# Patient Record
Sex: Male | Born: 1985 | Race: Black or African American | Hispanic: No | Marital: Single | Smoking: Current every day smoker
Health system: Southern US, Community
[De-identification: ages and names within clinical notes are randomized; demographics above are authoritative.]

## PROBLEM LIST (undated history)

## (undated) DIAGNOSIS — K219 Gastro-esophageal reflux disease without esophagitis: Secondary | ICD-10-CM

---
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
## 2004-11-12 ENCOUNTER — Emergency Department (HOSPITAL_COMMUNITY): Admission: EM | Admit: 2004-11-12 | Discharge: 2004-11-12 | Payer: Self-pay | Admitting: Emergency Medicine

## 2009-03-20 ENCOUNTER — Emergency Department (HOSPITAL_COMMUNITY): Admission: EM | Admit: 2009-03-20 | Discharge: 2009-03-20 | Payer: Self-pay | Admitting: Emergency Medicine

## 2011-06-22 ENCOUNTER — Inpatient Hospital Stay (INDEPENDENT_AMBULATORY_CARE_PROVIDER_SITE_OTHER)
Admission: RE | Admit: 2011-06-22 | Discharge: 2011-06-22 | Disposition: A | Discharge status: Home / Self Care | Payer: Self-pay | Source: Ambulatory Visit | Attending: Family Medicine | Admitting: Family Medicine

## 2011-06-22 DIAGNOSIS — J020 Streptococcal pharyngitis: Secondary | ICD-10-CM

## 2012-08-11 ENCOUNTER — Encounter (HOSPITAL_COMMUNITY): Payer: Self-pay | Admitting: Emergency Medicine

## 2012-08-11 ENCOUNTER — Emergency Department (HOSPITAL_COMMUNITY)
Admission: EM | Admit: 2012-08-11 | Discharge: 2012-08-12 | Disposition: A | Discharge status: Home / Self Care | Payer: Self-pay | Attending: Emergency Medicine | Admitting: Emergency Medicine

## 2012-08-11 DIAGNOSIS — F17200 Nicotine dependence, unspecified, uncomplicated: Secondary | ICD-10-CM | POA: Insufficient documentation

## 2012-08-11 DIAGNOSIS — IMO0002 Reserved for concepts with insufficient information to code with codable children: Secondary | ICD-10-CM | POA: Insufficient documentation

## 2012-08-11 DIAGNOSIS — L03119 Cellulitis of unspecified part of limb: Secondary | ICD-10-CM

## 2012-08-11 MED ORDER — DIPHENHYDRAMINE HCL 50 MG/ML IJ SOLN
25.0000 mg | Freq: Once | INTRAMUSCULAR | Status: AC
  Administered 2012-08-11: 25 mg via INTRAVENOUS
  Filled 2012-08-11: qty 1

## 2012-08-11 MED ORDER — CLINDAMYCIN PHOSPHATE 600 MG/50ML IV SOLN
600.0000 mg | Freq: Once | INTRAVENOUS | Status: AC
  Administered 2012-08-11: 600 mg via INTRAVENOUS
  Filled 2012-08-11: qty 50

## 2012-08-11 NOTE — ED Provider Notes (Signed)
History     CSN: [id number]  Arrival date & time 08/11/12  1826   First MD Initiated Contact with Patient 08/11/12 2236      Chief Complaint  Patient presents with  . Insect Bite    (Consider location/radiation/quality/duration/timing/severity/associated sxs/prior treatment) HPI History provided by pt.   Pt woke w/ multiple, pruritic insect bites on upper extremities 2 days ago.  Woke this am w/ edema, erythema and severe pruritis of dorsal surface of right forearm.  No associated fever or pain.  Edema has improved throughout course of day.  Has not taken anything for sx.  Denies trauma.  No PMH.   History reviewed. No pertinent past medical history.  History reviewed. No pertinent past surgical history.  No family history on file.  History  Substance Use Topics  . Smoking status: Current Every Day Smoker  . Smokeless tobacco: Not on file  . Alcohol Use: No      Review of Systems  All other systems reviewed and are negative.    Allergies  Review of patient's allergies indicates no known allergies.  Home Medications  No current outpatient prescriptions on file.  BP 125/87  Pulse 86  Temp 98.2 F (36.8 C) (Oral)  Resp 18  SpO2 98%  Physical Exam  Nursing note and vitals reviewed. Constitutional: He is oriented to person, place, and time. He appears well-developed and well-nourished. No distress.  HENT:  Head: Normocephalic and atraumatic.  Eyes:       Normal appearance  Neck: Normal range of motion.  Pulmonary/Chest: Effort normal.  Musculoskeletal: Normal range of motion.       Entire dorsal aspect of left forearm with erythema and warmth to touch.  Evidence of lymphatic drainage on inner aspect of arm.  No obvious insect bite or abscess in this location.  Non-tender.    Neurological: He is alert and oriented to person, place, and time.  Psychiatric: He has a normal mood and affect. His behavior is normal.    ED Course  Procedures (including critical  care time)  Labs Reviewed - No data to display No results found.   1. Cellulitis of forearm       MDM  Healthy 25yo M presents w/ cellulitis of left forearm.  Rash is pruritic but there is associated edema and evidence of lymphatic drainage.  Pt received one dose of IV clinda as well as benadryl.  The edema mildly improved and patient is requesting discharge.  Nursing staff has drawn margins of infection.  Pt d/c'd home w/ clinda.  Return precautions discussed.         Otilio Miu, [place] 08/12/12 0110

## 2012-08-11 NOTE — ED Notes (Signed)
Pt reports possible spider bite on left arm noted yesterday morning. Pt also reports multiple mosquito bites on right arm.

## 2012-08-12 MED ORDER — CLINDAMYCIN HCL 150 MG PO CAPS: 300.0000 mg | ORAL_CAPSULE | Freq: Two times a day (BID) | ORAL | Status: DC

## 2012-08-13 NOTE — ED Provider Notes (Signed)
Medical screening examination/treatment/procedure(s) were performed by non-physician practitioner and as supervising physician I was immediately available for consultation/collaboration.   Richardean Canal, MD 08/13/12 1745

## 2013-05-06 ENCOUNTER — Emergency Department (HOSPITAL_COMMUNITY)
Admission: EM | Admit: 2013-05-06 | Discharge: 2013-05-06 | Disposition: A | Discharge status: Home / Self Care | Payer: Self-pay | Attending: Emergency Medicine | Admitting: Emergency Medicine

## 2013-05-06 ENCOUNTER — Encounter (HOSPITAL_COMMUNITY): Payer: Self-pay | Admitting: Emergency Medicine

## 2013-05-06 DIAGNOSIS — R0982 Postnasal drip: Secondary | ICD-10-CM | POA: Insufficient documentation

## 2013-05-06 DIAGNOSIS — R220 Localized swelling, mass and lump, head: Secondary | ICD-10-CM | POA: Insufficient documentation

## 2013-05-06 DIAGNOSIS — K1379 Other lesions of oral mucosa: Secondary | ICD-10-CM

## 2013-05-06 DIAGNOSIS — K029 Dental caries, unspecified: Secondary | ICD-10-CM | POA: Insufficient documentation

## 2013-05-06 DIAGNOSIS — F17200 Nicotine dependence, unspecified, uncomplicated: Secondary | ICD-10-CM | POA: Insufficient documentation

## 2013-05-06 DIAGNOSIS — J3489 Other specified disorders of nose and nasal sinuses: Secondary | ICD-10-CM | POA: Insufficient documentation

## 2013-05-06 MED ORDER — DEXAMETHASONE 2 MG PO TABS
6.0000 mg | ORAL_TABLET | Freq: Once | ORAL | Status: AC
  Administered 2013-05-06: 6 mg via ORAL
  Filled 2013-05-06: qty 3

## 2013-05-06 NOTE — ED Notes (Signed)
PT. REPORTS SOB AFTER TAKING SOMEONE'S AMOXICILLIN THIS AFTERNOON , ALSO REPORTS SWOLLEN UVULA , RESPIRATIONS UNLABORED / O2 SAT = 100 % / AIRWAY INTACT.

## 2013-05-06 NOTE — ED Provider Notes (Signed)
Medical screening examination/treatment/procedure(s) were performed by non-physician practitioner and as supervising physician I was immediately available for consultation/collaboration.   Sweta Halseth M Jaivion Kingsley, MD 05/06/13 0500 

## 2013-05-06 NOTE — ED Provider Notes (Signed)
   History    CSN: [id number] Arrival date & time 05/06/13  0007  First MD Initiated Contact with Patient 05/06/13 0122     Chief Complaint  Patient presents with  . Shortness of Breath   (Consider location/radiation/quality/duration/timing/severity/associated sxs/prior Treatment) HPI Comments: Patient took an amoxicillin due to several cavities than developed a feeling of SOB than has resolved, the Rx was not his he has not know allergies. Now reports that his  uvula is swollen   Patient is a 27 y.o. male presenting with shortness of breath. The history is provided by the patient.  Shortness of Breath Severity:  Mild Timing:  Constant Progression:  Resolved Chronicity:  New Context: not known allergens   Relieved by:  Rest Worsened by:  Nothing tried Ineffective treatments:  None tried Associated symptoms: no chest pain, no fever, no headaches, no neck pain, no rash, no sore throat and no wheezing    History reviewed. No pertinent past medical history. History reviewed. No pertinent past surgical history. No family history on file. History  Substance Use Topics  . Smoking status: Current Every Day Smoker  . Smokeless tobacco: Not on file  . Alcohol Use: No    Review of Systems  Constitutional: Negative for fever.  HENT: Positive for congestion, dental problem and postnasal drip. Negative for sore throat, trouble swallowing, neck pain, neck stiffness and voice change.   Respiratory: Positive for shortness of breath. Negative for wheezing.   Cardiovascular: Negative for chest pain.  Gastrointestinal: Negative for nausea.  Skin: Negative for rash.  Neurological: Negative for dizziness and headaches.    Allergies  Review of patient's allergies indicates no known allergies.  Home Medications   Current Outpatient Rx  Name  Route  Sig  Dispense  Refill  . clindamycin (CLEOCIN) 150 MG capsule   Oral   Take 2 capsules (300 mg total) by mouth 2 (two) times daily.   14  capsule   0    BP 140/87  Pulse 70  Temp(Src) 97.9 F (36.6 C) (Oral)  Resp 17  SpO2 100% Physical Exam  Constitutional: He is oriented to person, place, and time. He appears well-developed and well-nourished.  HENT:  Head: Normocephalic and atraumatic.  Right Ear: External ear normal.  Left Ear: External ear normal.  Mouth/Throat: Uvula is midline. Edematous present.    Neck: Normal range of motion.  Cardiovascular: Normal rate and regular rhythm.   Pulmonary/Chest: Effort normal and breath sounds normal. No respiratory distress. He has no wheezes. He exhibits no tenderness.  Abdominal: Soft.  Musculoskeletal: Normal range of motion.  Lymphadenopathy:    He has no cervical adenopathy.  Neurological: He is alert and oriented to person, place, and time.  Skin: Skin is warm. No erythema.    ED Course  Procedures (including critical care time) Labs Reviewed - No data to display No results found. No diagnosis found.  MDM   Patient given Decadron for swelling and possible allergic reaction although not in any distress at this time   Arman Filter, NP 05/06/13 0205

## 2013-05-06 NOTE — ED Notes (Signed)
Patient states that he has felt that his "uvula" has been swollen for the past couple of weeks and felt a "knot" in his throat. Due to this, today the patient stated "I took an amoxicillin from a friend thinking it would help my throat." Approx 2 hours after taking medication patient states that he became short of breath and felt that he could not take a full breath. Pt friend states that she also believes that anxiety has something to do with the way he is feeling right now, due to "a lot of stress in his life." O2 100% on RA.

## 2013-05-07 ENCOUNTER — Telehealth (HOSPITAL_COMMUNITY): Payer: Self-pay | Admitting: *Deleted

## 2013-05-15 ENCOUNTER — Encounter (HOSPITAL_COMMUNITY): Payer: Self-pay | Admitting: *Deleted

## 2013-05-15 ENCOUNTER — Emergency Department (INDEPENDENT_AMBULATORY_CARE_PROVIDER_SITE_OTHER)
Admission: EM | Admit: 2013-05-15 | Discharge: 2013-05-15 | Disposition: A | Discharge status: Home / Self Care | Payer: Self-pay | Source: Home / Self Care | Attending: Family Medicine | Admitting: Family Medicine

## 2013-05-15 DIAGNOSIS — R140 Abdominal distension (gaseous): Secondary | ICD-10-CM

## 2013-05-15 DIAGNOSIS — R143 Flatulence: Secondary | ICD-10-CM

## 2013-05-15 DIAGNOSIS — R1013 Epigastric pain: Secondary | ICD-10-CM

## 2013-05-15 MED ORDER — OMEPRAZOLE 20 MG PO CPDR: 20.0000 mg | DELAYED_RELEASE_CAPSULE | Freq: Every day | ORAL | Status: DC

## 2013-05-15 MED ORDER — ALUM & MAG HYDROXIDE-SIMETH 400-400-40 MG/5ML PO SUSP: 10.0000 mL | Freq: Four times a day (QID) | ORAL | Status: DC | PRN

## 2013-05-15 NOTE — ED Provider Notes (Signed)
History    CSN: [id number] Arrival date & time 05/15/13  1050  First MD Initiated Contact with Patient 05/15/13 1131     Chief Complaint  Patient presents with  . Sore Throat   (Consider location/radiation/quality/duration/timing/severity/associated sxs/prior Treatment) HPI Comments: 27 year old male presents complaining of intermittent upper abdominal bloating, feelings of early satiety, and feeling swelling in his throat like he is "swallowing his uvula." He also woke up yesterday and vomited but has not felt nauseous other than this. This is been going on intermittently for months but he states that today it is actually feeling better than it was. He describes the bloating as feeling like there is a large amount of gas in his stomach and he feels like eating makes this worse. He also admits to frequent belching and flatulence as well as a recent history of constipation that now seems to have resolved. He denies fever, palpitations, abdominal pain, shortness of breath, chest pain, or rash. He has been taking ibuprofen for a recent dental extraction.  Patient is a 27 y.o. male presenting with pharyngitis.  Sore Throat Pertinent negatives include no chest pain, no abdominal pain and no shortness of breath.   History reviewed. No pertinent past medical history. History reviewed. No pertinent past surgical history. History reviewed. No pertinent family history. History  Substance Use Topics  . Smoking status: Current Every Day Smoker  . Smokeless tobacco: Not on file  . Alcohol Use: No    Review of Systems  Constitutional: Negative for fever, chills, fatigue and unexpected weight change.  HENT: Positive for sore throat. Negative for ear pain, sneezing, mouth sores, trouble swallowing, neck pain, neck stiffness, sinus pressure and ear discharge.   Eyes: Negative for pain, redness and visual disturbance.  Respiratory: Negative for cough and shortness of breath.   Cardiovascular:  Negative for chest pain, palpitations and leg swelling.  Gastrointestinal: Positive for constipation and abdominal distention. Negative for nausea, vomiting, abdominal pain, diarrhea, blood in stool, anal bleeding and rectal pain.       Bloating  Endocrine: Negative for polydipsia and polyuria.  Genitourinary: Negative for dysuria, urgency, frequency and hematuria.  Musculoskeletal: Negative for myalgias and arthralgias.  Skin: Negative for rash.  Neurological: Negative for dizziness, weakness and light-headedness.    Allergies  Amoxicillin  Home Medications   Current Outpatient Rx  Name  Route  Sig  Dispense  Refill  . alum & mag hydroxide-simeth (MAALOX PLUS) 400-400-40 MG/5ML suspension   Oral   Take 10 mLs by mouth every 6 (six) hours as needed for indigestion.   355 mL   0   . clindamycin (CLEOCIN) 150 MG capsule   Oral   Take 2 capsules (300 mg total) by mouth 2 (two) times daily.   14 capsule   0   . omeprazole (PRILOSEC) 20 MG capsule   Oral   Take 1 capsule (20 mg total) by mouth daily.   30 capsule   0    BP 107/68  Pulse 52  Temp(Src) 98.1 F (36.7 C) (Oral)  Resp 18  SpO2 100% Physical Exam  Nursing note and vitals reviewed. Constitutional: He is oriented to person, place, and time. He appears well-developed and well-nourished. No distress.  HENT:  Head: Normocephalic and atraumatic.  Mouth/Throat: Uvula is midline, oropharynx is clear and moist and mucous membranes are normal. No oral lesions. No dental abscesses or edematous.  Eyes: EOM are normal. Pupils are equal, round, and reactive to light.  Cardiovascular: Normal  rate and regular rhythm.  Exam reveals no gallop and no friction rub.   No murmur heard. Pulmonary/Chest: Effort normal and breath sounds normal. No respiratory distress. He has no wheezes. He has no rales.  Abdominal: Soft. There is no hepatosplenomegaly. There is no tenderness. There is no rigidity, no rebound, no guarding, no CVA  tenderness, no tenderness at McBurney's point and negative Murphy's sign.  Neurological: He is oriented to person, place, and time.  Skin: Skin is warm and dry. No rash noted.  Psychiatric: He has a normal mood and affect. Judgment normal.    ED Course  Procedures (including critical care time) Labs Reviewed  POCT RAPID STREP A ([facility] ONLY)   No results found. 1. Dyspepsia   2. Abdominal bloating     MDM  This patient's symptoms all seem to be resolving. His abdomen is completely nontender and vitals all normal so no concern for acute abdomen. We'll treat this symptomatically and have him followup with his primary care physician in one month for a checkup   Meds ordered this encounter  Medications  . omeprazole (PRILOSEC) 20 MG capsule    Sig: Take 1 capsule (20 mg total) by mouth daily.    Dispense:  30 capsule    Refill:  0  . alum & mag hydroxide-simeth (MAALOX PLUS) 400-400-40 MG/5ML suspension    Sig: Take 10 mLs by mouth every 6 (six) hours as needed for indigestion.    Dispense:  355 mL    Refill:  0     Graylon Good, PA-C 05/15/13 1201

## 2013-05-15 NOTE — ED Notes (Signed)
Pt  Reports  Symptoms  Of  sorethroat  X  1  Day    Vomited   Test   Pt  Also  Reports   Sensation of  Epigastric  blaoting as  Well  As  Decreased  Appetite   X  1  Week   Had  Recent tooth  Extraction        And  Is  Taking  Motrin   Pt  States  Last  Week he  May  Have  Had  An allergic  Reaction to  An  amox     And  Had  What  He  Stated  Was  A  Swollen  Uvula    At  This  Time  He  Is  Sitting  Upright on  Exam table  Speaking in  Complete  sentances  And  Is  In no  Distress

## 2013-05-17 LAB — CULTURE, GROUP A STREP

## 2013-05-17 NOTE — ED Provider Notes (Signed)
Medical screening examination/treatment/procedure(s) were performed by non-physician practitioner and as supervising physician I was immediately available for consultation/collaboration.   MORENO-COLL,Abrian Hanover; MD  Cassandra Mcmanaman Moreno-Coll, MD 05/17/13 0754 

## 2013-05-24 ENCOUNTER — Encounter (HOSPITAL_COMMUNITY): Payer: Self-pay | Admitting: *Deleted

## 2013-05-24 ENCOUNTER — Emergency Department (HOSPITAL_COMMUNITY)
Admission: EM | Admit: 2013-05-24 | Discharge: 2013-05-24 | Disposition: A | Discharge status: Home / Self Care | Payer: Self-pay | Attending: Emergency Medicine | Admitting: Emergency Medicine

## 2013-05-24 DIAGNOSIS — Z711 Person with feared health complaint in whom no diagnosis is made: Secondary | ICD-10-CM | POA: Insufficient documentation

## 2013-05-24 DIAGNOSIS — F17200 Nicotine dependence, unspecified, uncomplicated: Secondary | ICD-10-CM | POA: Insufficient documentation

## 2013-05-24 NOTE — ED Notes (Signed)
Pt has multiple complaints. Reports constipation but last bm was this am. Has been treated recently for acid reflux and bloating, was started on meds and reports still has sensation that something is stuck in his throat. No distress noted at triage, airway intact.

## 2013-05-24 NOTE — Discharge Instructions (Signed)
Emergency Department Screening Exam A medical screening exam helps find the cause of your problem. This exam also helps determine if your problem requires treatment right away. Your exam has shown that you do not require immediate emergency treatment. It is safe for you to go to your caregiver's office or clinic for treatment. Plans may have been made for you to be seen by your regular caregiver today. Patients must be treated in an emergency department regardless of their ability to pay. You can decide to stay and receive continued treatment in the emergency department. Some insurance plans may not cover the cost of this service. In some, but not all, states in the [place] the hospital and/or your caregiver might bill you directly for your evaluation and treatment in the emergency department. If your condition worsens or changes in any way, return for re-evaluation or you may go to your caregiver's office or clinic for treatment. Document Released: 01/20/2009 Document Revised: 01/16/2012 Document Reviewed: 01/20/2009 [facility] Patient Information 2014 [place], [place].  RESOURCE GUIDE  Chronic Pain Problems: Contact [facility]  [phone] Patients need to be referred by their primary care doctor.  Insufficient Money for Medicine: Contact United Way:  call "211."   No Primary Care Doctor: - Call Health Connect  [phone] - can help you locate a primary care doctor that  accepts your insurance, provides certain services, etc. - Physician Referral Service- [phone]  Agencies that provide inexpensive medical care: - [facility]  [phone] - [facility]  [phone] - [facility]  [phone] - Women's Clinic  [phone] - Planned Parenthood  [phone] [facility]  [phone]  Medicaid-accepting [facility] Providers: - [facility]- [address], [address]  [phone], Mon-Fri 9am-7pm, Sat  9am-1pm - [facility]- [address] [place], Suite [place]  [phone] - [facility]- [address], Suite [place]  [phone] [facility]- [address]  [phone] - Renaye Rakers- [address] [place], [address], [phone]  Only accepts [place] patients after they have their name  applied to their card  Self Pay (no insurance) in [place]: - Sickle Cell Patients - [facility]  [address] [place], [phone] - [facility]- [address] [place]  [phone]       [facility] [place]- [address]       -     [facility]- see information above (Speak to [employer] if you do not have insurance)       -  [facility]- [address],  [phone]       -  [facility]- [address] [phone]       -  Dr Julio Sicks-  [address], [place], [phone]       -  [facility] - [address] [phone]       -  [facility]- [address], [phone], also [address], [phone]       -     [facility]- [address] [place], [phone], 1st & 3rd Saturday         every month, 10am-1pm  -     [facility]   [address], [place].   Phone:  [phone], Fax:  [phone]. Hours of Operation:  9 am - 6 pm, M-F.  -     [facility]   [address], [place]   Phone: [phone], Fax: [phone]. Hours of Operation:  8:30 am - 5:30 pm, M-F.  [facility] [address] [place], [place] [address] [phone]  [facility] [address] [place], [place] [address] [phone]  1) Find a Doctor and Pay Out of Pocket Although you won't have to find out who is covered by your insurance plan, it is a good idea to ask around and get recommendations. You will then  need to call the office and see if the doctor you have chosen will accept you as a new patient and what types of options they offer for patients who are self-pay. Some doctors offer discounts or will set up payment plans for their patients who do not have insurance, but you will need to ask so you aren't surprised when you get to your appointment.  2) Contact Your Local Health Department Not all health departments have doctors that can see patients for sick visits, but many do, so it is worth a call to see if yours does. If you don't know where your local health department is, you can check in your phone book. The CDC also has a tool to help you locate your state's health department, and many state websites also have listings of all of their local health departments.  3) Find a Walk-in Clinic If your illness is not likely to be very severe or complicated, you may want to try a walk in clinic. These are popping up all over the country in pharmacies, drugstores, and shopping centers. They're usually staffed by nurse practitioners or physician assistants that have been trained to treat common illnesses and complaints. They're usually fairly quick and inexpensive. However, if you have serious medical issues or chronic medical problems, these are probably not your best option  STD Testing - [facility] Department of [facility], STD Clinic, [address], [place], phone [phone] or [phone].  Monday - Friday, call for an appointment. [facility] Department of [employer], STD Clinic, [address], [place], phone [phone] or [phone].  Monday - Friday, call for an appointment.  Abuse/Neglect: [facility] Child Abuse Hotline 440-375-1072 [facility] Child Abuse Hotline (902)262-7419 (After Hours)  Emergency Shelter:  [facility] 918-285-0850  Maternity Homes: - Room at the [place] [facility] [phone] - [facility] [phone]  MRSA Hotline #:   310 553 5438  Dental Assistance If unable to pay or uninsured, contact:  [facility]. to become qualified for the adult dental clinic.  Patients with Medicaid: [facility] [phone], [phone] [address], [phone]  If unable to pay, or uninsured, contact [facility] [phone] in [place], [phone] in [place] [facility]) to become qualified for the adult dental clinic  [facility] [address] [place], [place] [address] [phone] [email or web address]  Other Proofreader Services: - [facility]- [address] [place], [place], [place], [address], [phone], Ext. [phone], 2nd and 4th Thursday of the month at 6:30am.  10 clients each day by appointment, can sometimes see walk-in patients if someone does not show for an appointment. [facility]  Center- [address] [place], [place], [address], [phone] - [facility] [address] [place], [place], [address], [phone] - [facility]- [phone] [facility] Health Department- [phone] [facility] Health [phone]       Behavioral Health Resources in the [facility]  Intensive Outpatient Programs: [facility]      [address] [place], [place] [phone] Both a day and evening program       [facility] Outpatient     [address]        [place], [place] [address] [phone]         ADS: [facility] [address] [place] [phone]  [facility] Mental Health ACCESS LINE: (316)[phone] or ([phone] [address] [place], [place] [address] [email or web address]   Substance Abuse Resources: - [facility] Drug Services  [phone] - [facility] [phone] - The  [place] [phone] Floydene Flock [phone] - Residential & Outpatient Substance Abuse Program  [phone]  Psychological Services: [facility]  [phone] [facility] Services  [phone] - [facility], [phone] [place]. [address] [place], ACCESS LINE: [phone] or (941)[phone], [email or web address]  Mobile Crisis Teams:                                        Therapeutic Alternatives         Mobile Crisis Care Unit [phone]             [facility] [address] Ginette Otto [phone]                                         Interventionist [address] [place] [address] [place] [phone]  Self-Help/Support Groups: Mental Health Assoc. of [employer] of support groups 801-517-4301 (call for more info)  Narcotics Anonymous (NA) Caring Services [address] [place] - 2 meetings at this location  Residential Treatment Programs:  [facility]      [address]        [place]       [phone]         [facility] [address], [place] [address]83 [place], [place]  [address] [phone]  [facility]  [address] [place], [place] [address] [phone] Admissions: 8am-3pm M-F  [facility]     [address]        [place], [place] [address]       [phone]         [facility] [address] Starling Manns [employer], [place] [phone]  [facility] [address] [place], [place] [phone]  [facility] - Intensive Outpatient      [address]     [place], [place]       [phone]         [facility] ([facility]     [address] [place], [place] [phone] or [phone]  Residential Treatment Services (RTS), Medicaid [address] [place], [place] [phone]  [facility] Margo Aye  [address] [place] [phone]  [facility] Resources: [phone]               General Therapy                                                Angie Fava, PhD        [address] [place], [place] [address]         [phone]   Insurance  [facility] Behavioral   [address] [place], [place] [address] [phone]  [facility] [address] [place], [place] [address] [phone] Insurance/Medicaid/sponsorship through [facility]                                              [address]                                        [place], [place] [address]    Therapy/tele-psych/case         [phone]          [facility] [address], [place]  [address]  Adolescent/group home/case management [phone]                                           Creola Corn PhD       General therapy       Insurance   [phone]         Dr. Lolly Mustache, Insurance, M-F [phone]  [facility]  [employer] [facility] Dept. [address]                 [address]         [address] [place] [address]  Blondell Reveal Phone:  [phone]                                  Phone:  [phone]                   Phone:  [phone]  [facility], [phone] - [facility] - [phone]       -     [facility] in [place], [address],             [phone], Insurance  [place] Child Abuse Hotline 562-315-8978 or [phone] (After Hours)

## 2013-05-24 NOTE — ED Notes (Signed)
Pt reports feeling constipated for over one week although pt reports having bm today that was normal consistency.  Pt states he normally goes 2-3 times a day but onlu going once a day is not normal.  Pt also co feeling as if something in his throat but this feeling is relieved when he drinks water and normally only happens when he is thirsty.  Pt reports abdominal pain occasionally and states it feels like something is moving around in his abdomin. RN assessed bowel sounds and obvious sounds in all 4 quadrants. Pt states he was seen for reflux over a week ago and was rx prilocec but that gave him a headache.  He takes mylanta as needed.  Pt alert oriented X4

## 2013-05-24 NOTE — ED Provider Notes (Signed)
   History    CSN: [id number] Arrival date & time 05/24/13  1110  First MD Initiated Contact with Patient 05/24/13 1254     Chief Complaint  Patient presents with  . Constipation   (Consider location/radiation/quality/duration/timing/severity/associated sxs/prior Treatment) HPI  27 year old male with no significant past medical history he presents complaining of abdominal discomfort.  Patient reports he usually has 2-3 bowel movements a day. He felt constipated today since he only had one bowel movement this morning. Bowel movement was unremarkable there is no evidence of blood or mucus. He denies any fever, chills, headache, chest pain, shortness of breath, abdominal pain, back pain, nausea, vomiting, diarrhea, or rash. Denies any dysuria, hematuria, hematochezia, or melena. He has been eating and drinking okay. He has tried to quit smoking for all week. He is starting to eat more fruit that has high fiber. Otherwise patient has no significant past medical history, no prior abdominal surgery. He is able to pass flatus without difficulty. He does not have a primary care Dr.  History reviewed. No pertinent past medical history. History reviewed. No pertinent past surgical history. History reviewed. No pertinent family history. History  Substance Use Topics  . Smoking status: Current Every Day Smoker  . Smokeless tobacco: Not on file  . Alcohol Use: No    Review of Systems  All other systems reviewed and are negative.    Allergies  Amoxicillin and Prilosec  Home Medications   Current Outpatient Rx  Name  Route  Sig  Dispense  Refill  . alum & mag hydroxide-simeth (MAALOX PLUS) 400-400-40 MG/5ML suspension   Oral   Take 10 mLs by mouth every 6 (six) hours as needed for indigestion.   355 mL   0   . ibuprofen (ADVIL,MOTRIN) 200 MG tablet   Oral   Take 200 mg by mouth every 6 (six) hours as needed for pain or headache.          BP 134/90  Pulse 73  Temp(Src) 98.1 F  (36.7 C) (Oral)  Resp 18  SpO2 99% Physical Exam  Nursing note and vitals reviewed. Constitutional: He appears well-developed and well-nourished. No distress.  Awake, alert, nontoxic appearance  HENT:  Head: Atraumatic.  Eyes: Conjunctivae are normal. Right eye exhibits no discharge. Left eye exhibits no discharge.  Neck: Normal range of motion. Neck supple.  Cardiovascular: Normal rate and regular rhythm.   Pulmonary/Chest: Effort normal. No respiratory distress. He exhibits no tenderness.  Abdominal: Soft. There is no tenderness. There is no rebound.  Genitourinary: Rectum normal.  Musculoskeletal: He exhibits no tenderness.  Neurological: He is alert.  Skin: Skin is warm and dry. No rash noted.  Psychiatric: He has a normal mood and affect.    ED Course  Procedures (including critical care time)  1:40 PM Patient presents with symptoms of constipation. His abdomen is soft, nontender with normal bowel sounds. His last bowel movement was this morning. Patient appears to be physically well but worried. No acute emergent symptoms. Reassurance given. Recommend followup with a primary care Dr. for further care. Patient encouraged to continue with smoking cessation and eating food with fiber.  Labs Reviewed - No data to display No results found. 1. Physically well but worried     MDM  BP 134/90  Pulse 73  Temp(Src) 98.1 F (36.7 C) (Oral)  Resp 18  SpO2 99%   Fayrene Helper, PA-C 05/24/13 1342

## 2013-05-24 NOTE — ED Provider Notes (Signed)
Medical screening examination/treatment/procedure(s) were performed by non-physician practitioner and as supervising physician I was immediately available for consultation/collaboration.  Glynn Octave, MD 05/24/13 [phone]

## 2014-01-23 ENCOUNTER — Encounter (HOSPITAL_COMMUNITY): Payer: Self-pay | Admitting: Emergency Medicine

## 2014-01-23 ENCOUNTER — Emergency Department (HOSPITAL_COMMUNITY)
Admission: EM | Admit: 2014-01-23 | Discharge: 2014-01-24 | Disposition: A | Discharge status: Home / Self Care | Payer: Self-pay | Attending: Emergency Medicine | Admitting: Emergency Medicine

## 2014-01-23 DIAGNOSIS — M549 Dorsalgia, unspecified: Secondary | ICD-10-CM | POA: Insufficient documentation

## 2014-01-23 DIAGNOSIS — M62830 Muscle spasm of back: Secondary | ICD-10-CM

## 2014-01-23 DIAGNOSIS — H538 Other visual disturbances: Secondary | ICD-10-CM | POA: Insufficient documentation

## 2014-01-23 DIAGNOSIS — F410 Panic disorder [episodic paroxysmal anxiety] without agoraphobia: Secondary | ICD-10-CM

## 2014-01-23 DIAGNOSIS — R002 Palpitations: Secondary | ICD-10-CM | POA: Insufficient documentation

## 2014-01-23 DIAGNOSIS — R0602 Shortness of breath: Secondary | ICD-10-CM | POA: Insufficient documentation

## 2014-01-23 DIAGNOSIS — M5380 Other specified dorsopathies, site unspecified: Secondary | ICD-10-CM | POA: Insufficient documentation

## 2014-01-23 DIAGNOSIS — F17200 Nicotine dependence, unspecified, uncomplicated: Secondary | ICD-10-CM | POA: Insufficient documentation

## 2014-01-23 DIAGNOSIS — R0789 Other chest pain: Secondary | ICD-10-CM | POA: Insufficient documentation

## 2014-01-23 DIAGNOSIS — IMO0001 Reserved for inherently not codable concepts without codable children: Secondary | ICD-10-CM | POA: Insufficient documentation

## 2014-01-23 DIAGNOSIS — F411 Generalized anxiety disorder: Secondary | ICD-10-CM | POA: Insufficient documentation

## 2014-01-23 DIAGNOSIS — R42 Dizziness and giddiness: Secondary | ICD-10-CM | POA: Insufficient documentation

## 2014-01-23 DIAGNOSIS — R209 Unspecified disturbances of skin sensation: Secondary | ICD-10-CM | POA: Insufficient documentation

## 2014-01-23 DIAGNOSIS — Z8719 Personal history of other diseases of the digestive system: Secondary | ICD-10-CM | POA: Insufficient documentation

## 2014-01-23 HISTORY — DX: Gastro-esophageal reflux disease without esophagitis: K21.9

## 2014-01-23 NOTE — ED Notes (Signed)
Pt arrives to ER via EMS for complaints of anxiety; pt states that this episode has been lasting ; per EMS upon arrival pt was hyperventilating and c/o of numbness, tingling and lightheaded; pt denies these symptoms upon arrival to ER but c/o of still feeling anxious; pt states that his grandmother passed away 9 months ago and he has been having anxiety since then.

## 2014-01-24 MED ORDER — CYCLOBENZAPRINE HCL 10 MG PO TABS: 10.0000 mg | ORAL_TABLET | Freq: Two times a day (BID) | ORAL | Status: DC | PRN

## 2014-01-24 NOTE — ED Provider Notes (Signed)
CSN: [id number]     Arrival date & time 01/24/14  0043 History   First MD Initiated Contact with Patient 01/23/14 2331     Chief Complaint  Patient presents with  . Anxiety     (Consider location/radiation/quality/duration/timing/severity/associated sxs/prior Treatment) HPI Pt is a 27yo male brought to ED by EMS c/o anxiety.  Pt states he was lying in his bed earlier tonight reading a training manual for work when his symptoms started. Pt started to have SOB, blurred vision, palpitations and stated he felt like he was going to pass out. Reports symptoms did improve some with cold water but returned again.  Symptoms were lasting over 45 min when pt's mother called EMS.  Per triage note per EMS, pt was hyperventilating and c/o numbness, tinlging, and lightheaded. Symptoms resolved upon arrival to ED.  Pt states he has had similar symptoms in past but never as intense and they never lasted this long. Reports his grandmother passed away 9 months ago and he has been having increased anxiety since then.  Pt denies any significant PMH. Denies hx of asthma or cardiac problems.  Pt has not taken medication for his anxiety.    Pt also c/o areas of muscle spasms on his back that come and go. Pain is "twinge-like" 7/10 at worse. Nothing makes better or worse.  Denies known injuries.   Past Medical History  Diagnosis Date  . GERD (gastroesophageal reflux disease)    History reviewed. No pertinent past surgical history. No family history on file. History  Substance Use Topics  . Smoking status: Current Every Day Smoker -- 0.50 packs/day    Types: Cigarettes  . Smokeless tobacco: Not on file  . Alcohol Use: Yes     Comment: occ    Review of Systems  Constitutional: Negative for fever and chills.  Eyes: Positive for visual disturbance. Negative for photophobia.  Respiratory: Positive for chest tightness and shortness of breath. Negative for cough.   Cardiovascular: Positive for chest pain.   Gastrointestinal: Negative for nausea, vomiting, abdominal pain and diarrhea.  Musculoskeletal: Positive for back pain and myalgias.  Neurological: Positive for light-headedness and numbness. Negative for dizziness, weakness and headaches.  All other systems reviewed and are negative.      Allergies  Amoxicillin and Prilosec  Home Medications   Current Outpatient Rx  Name  Route  Sig  Dispense  Refill  . cyclobenzaprine (FLEXERIL) 10 MG tablet   Oral   Take 1 tablet (10 mg total) by mouth 2 (two) times daily as needed for muscle spasms.   20 tablet   0   . ibuprofen (ADVIL,MOTRIN) 200 MG tablet   Oral   Take 200 mg by mouth every 6 (six) hours as needed for pain or headache.          BP 101/62  Pulse 68  Temp(Src) 97.7 F (36.5 C) (Oral)  Resp 18  SpO2 97% Physical Exam  Nursing note and vitals reviewed. Constitutional: He appears well-developed and well-nourished.  Pt appears well, calm. NAD.  HENT:  Head: Normocephalic and atraumatic.  Eyes: Conjunctivae are normal. No scleral icterus.  Neck: Normal range of motion. Neck supple.  Cardiovascular: Normal rate, regular rhythm and normal heart sounds.   Pulmonary/Chest: Effort normal and breath sounds normal. No respiratory distress. He has no wheezes. He has no rales. He exhibits no tenderness.  No respiratory distress, able to speak in full sentences w/o difficulty. Lungs: CTAB  Abdominal: Soft. Bowel sounds are normal.  He exhibits no distension and no mass. There is no tenderness. There is no rebound and no guarding.  Musculoskeletal: Normal range of motion. He exhibits tenderness ( point tenderness in right upper trapezius and left mid-thoracic musculature.).  No midline spinal tenderness.  Neurological: He is alert.  Skin: Skin is warm and dry.  Psychiatric: He has a normal mood and affect. His behavior is normal.    ED Course  Procedures (including critical care time) Labs Review Labs Reviewed - No data  to display Imaging Review No results found.   EKG Interpretation None      MDM   Final diagnoses:  Anxiety attack  Back muscle spasm    Pt with hx of anxiety attacks presenting to ED via EMS c/o similar symptoms which have since resolved upon arrival to ED.  Not concerned for ACS, PE, or other emergent process taking place at this time. Do not believe further workup is needed. Pt does have point tenderness in back musculature. Will tx symptomatically for pain. Rx: flexeril & ibuprofen. Advised to f/u with PCP. Return precautions provided. Pt verbalized understanding and agreement with tx plan.     Junius Finner, PA-C 01/24/14 [phone]

## 2014-01-24 NOTE — Discharge Instructions (Signed)
°Emergency Department Resource Guide °1) Find a Doctor and Pay Out of Pocket °Although you won't have to find out who is covered by your insurance plan, it is a good idea to ask around and get recommendations. You will then need to call the office and see if the doctor you have chosen will accept you as a new patient and what types of options they offer for patients who are self-pay. Some doctors offer discounts or will set up payment plans for their patients who do not have insurance, but you will need to ask so you aren't surprised when you get to your appointment. ° °2) Contact Your Local Health Department °Not all health departments have doctors that can see patients for sick visits, but many do, so it is worth a call to see if yours does. If you don't know where your local health department is, you can check in your phone book. The CDC also has a tool to help you locate your state's health department, and many state websites also have listings of all of their local health departments. ° °3) Find a Walk-in Clinic °If your illness is not likely to be very severe or complicated, you may want to try a walk in clinic. These are popping up all over the country in pharmacies, drugstores, and shopping centers. They're usually staffed by nurse practitioners or physician assistants that have been trained to treat common illnesses and complaints. They're usually fairly quick and inexpensive. However, if you have serious medical issues or chronic medical problems, these are probably not your best option. ° °No Primary Care Doctor: °- Call [facility] at  832-8000 - they can help you locate a primary care doctor that  accepts your insurance, provides certain services, etc. °- Physician Referral Service- 1-800-533-3463 ° °Chronic Pain Problems: °Organization         Address  Phone   Notes  °[facility]  (336) 297-2271 Patients need to be referred by their primary care doctor.  ° °Medication  Assistance: °Organization         Address  Phone   Notes  °[place] [facility] [address] °[place], [place] [address] (336) 641-8030 --Must be a resident of [place] °-- Must have NO insurance coverage whatsoever (no Medicaid/ Medicare, etc.) °-- The pt. MUST have a primary care doctor that directs their care regularly and follows them in the community °  °[facility]  (866) 331-1348   °[employer]  (888) 892-1162   ° °Agencies that provide inexpensive medical care: °Organization         Address  Phone   Notes  °[facility]  (336) 832-8035   °[facility]    (336) 832-7272   °[facility] [address] °[place],  [address] (336) 832-4777   °[facility] [place] [address], °[place] (336) 271-4999   °[facility]    (336) 373-0678   °[facility]    (336) 272-1050   °[facility] ° [address], [place] Phone:  (336) 832-4444, Fax:  (336) 832-4440 Hours of Operation:  9 am - 6 pm, M-F.  Also accepts Medicaid/Medicare and self-pay.  °[facility] ° [address],  Phone: (336) 832-3150, Fax: (336) 832-3151. Hours of Operation:  8:30 am - 5:30 pm, M-F.  Also accepts Medicaid and self-pay.  °[facility] [place] [address], [place] Phone: (336) 878-6027   °[facility] [address], [place], [place] (336)723-1848, Ext. 123 Mondays & Thursdays: 7-9 AM.  First 15 patients are seen on a first come, first serve basis. °  ° °Medicaid-accepting [place] Providers: ° °Organization         Address  Phone   Notes  °[facility] [address], [place] (336) 641-2100 Also accepts self-pay patients.  °[facility] [address], [place] ° (336) 856-9996   °[facility] [address], [place]  (336) 288-8857   °[facility] [address] [place] [address], [place] (336) 299-7000   °Veita Bland [address], [place]  ° (336) 373-1557 Only accepts [place] Medicaid patients after they have their name applied to their card.  ° °Self-Pay (no insurance) in [place]: ° °Organization         Address  Phone   Notes  °Sickle Cell Patients, [facility] [address], [place] (336) 832-1970   °[facility] [address], [place] (336) 832-4400   °[facility] ° [address], [place] (336) 992-4800   °[facility]/Dr. Osei-Bonsu ° [address] [place] [address], [place] or [address], [place] (336) 841-8500 Phone number for both [place] and [place] locations is the same.  °[facility] [address], [place] (336) 299-0000   °[facility] [address] [place] [address], [place] or [address] (336) 852-7530 °(336) 878-2260   °[facility] [address], [place] (336) 350-1642, phone; (336) 294-5005, fax Sees patients 1st and 3rd Saturday of every month.  Must not qualify for public or private insurance (i.e. Medicaid, Medicare, [place] Health Choice, Veterans' Benefits) • Household income should be no more than 200% of the poverty level •The clinic cannot treat you if you are pregnant or think you are pregnant • Sexually transmitted diseases are not treated at the clinic.  ° ° °Dental Care: °Organization         Address  Phone  Notes  °[place] [facility] [address], [place] (336) 641-6152 Accepts children up to age 21 who are enrolled in Medicaid or [place] Health Choice; pregnant women with a Medicaid card; and children who have applied for Medicaid or [place] Health Choice, but were declined, whose parents can pay a reduced fee at time of service.  °[place]  [facility] [place]  [address], [place] (336) 641-7733 Accepts children up to age 21 who are enrolled in Medicaid or [place] Health Choice; pregnant women with a Medicaid card; and children who have applied for Medicaid or [place] Health Choice, but were declined, whose parents can pay a reduced fee at time of service.  °[facility] ° [address],  (336) 641-4533 Patients are seen by appointment only. Walk-ins are not accepted. [facility] will see patients 18 years of age and older. °Monday - Tuesday (8am-5pm) °Most Wednesdays (8:30-5pm) °$30 per visit, cash only  °[facility] ° [address], [place] (336) 641-4533 Patients are seen by appointment only. Walk-ins are not accepted. [facility] will see patients 18 years of age and older. °One   Wednesday Evening (Monthly: Volunteer Based).  $30 per visit, cash only  °[facility]  (919) 537-3737 for adults; Children under age 4, call Graduate Pediatric Dentistry at (919) [phone]. Children aged 4-14, please call (919) [phone] to request a pediatric application. ° Dental services are provided in all areas of dental care including fillings, crowns and bridges, complete and partial dentures, implants, gum treatment, root canals, and extractions. Preventive care is also provided. Treatment is provided to both adults and children. °Patients are selected via a lottery and there is often a waiting list. °  °[facility] [address], °[place] ° (336) 763-8833 [email or web address] °  °[facility] [address], [place], [place] (336)723-1848, Ext. 123 Second and Fourth Thursday of each month, opens at 6:30 AM; Clinic ends at 9 AM.  Patients are seen on a first-come first-served basis, and a limited number are seen during each clinic.  ° °[facility] ° [address], [place], [place] (336) 723-7904    Eligibility Requirements °You must have lived in [place], [place], or [place] counties for at least the last three months. °  You cannot be eligible for state or federal sponsored healthcare insurance, including Veterans Administration, Medicaid, or Medicare. °  You generally cannot be eligible for healthcare insurance through your employer.  °  How to apply: °Eligibility screenings are held every Tuesday and Wednesday afternoon from 1:00 pm until 4:00 pm. You do not need an appointment for the interview!  °[facility] [address], [place], [place] 336-631-2330   °[facility]  336-342-8273   °[facility]  336-703-3100   °[facility]  336-570-6415   ° °Behavioral Health Resources in the Community: °Intensive Outpatient Programs °Organization         Address  Phone  Notes  °[facility] [address], [place], [place] 336-878-6098   °[facility] [address], [place], [place] 336-832-9800   °[facility] [address], [place], [place] ° 336-882-2125   °[facility] [address],  °[place], [place] 1-800-853-5163 or 336-641-4981   °Substance Abuse Resources °Organization         Address  Phone  Notes  °[facility]  336-882-2125   °[facility]  336-784-9470   °[facility]  336-285-9073   °[facility]  336-845-3988   °Residential & Outpatient Substance Abuse Program  1-800-659-3381   °Psychological Services °Organization         Address  Phone  Notes  °[facility]  336- 832-9600   °[facility]  336- 378-7881   °[facility] [address], [place] 1-800-853-5163 or 336-641-4981   ° °Mobile Crisis Teams °Organization         Address  Phone  Notes  °[facility], Mobile Crisis Care Unit  1-877-626-1772   °Assertive °[facility] ° [address]  [place], [place] 336-834-9664   °Sharon DeEsch [address] °[place] 336-554-5454   ° °Self-Help/Support Groups °Organization         Address  Phone             Notes  °[facility]  - variety of support groups  336- 373-1402 Call for more information  °Narcotics Anonymous (NA), [facility] [address], °[place]  2 meetings at this location  ° °  Residential Treatment Programs °Organization         Address  Phone  Notes  °[facility] [address],    °[place]  1-866-801-8205   °[facility] ° [address], [place], [place] 704-293-8524   °[facility] [address], [place] 336-845-3988 Admissions: 8am-3pm M-F  °[facility] [address]    °[place], [place] 336-841-1104   °[facility] [address], [place], [place] 336-379-7146   °[facility] [address]  °[place], [place] 336-285-9073   °[facility] Intensive Outpatient [address], [place], [place] 336-852-3033   °[facility] [address]  °[place], [place] 1-877-615-2722 or 336-784-9470   °[facility] [address] [place], [place] 336-227-7417 Accepts Medicaid  °[facility] [address]  °[place] 1-800-659-3381 Substance Abuse/Addiction Treatment  ° °[place] Behavioral Health Resources °Organization         Address  Phone  Notes  °[facility]  (888) 581-9988   °Julie Brannon, PhD [address] [place], [place]   (336) 349-5553 or (336) 951-0000   °[facility]   [address] °[place], [place] (336) 349-4454   °[facility] [address], [place], [place] (336) 342-8316 Insurance/Medicaid/sponsorship through [facility]  °[facility] [address]                                    [place], [place] (336) 342-8316 Therapy/tele-psych/case    °[facility] [address]  ° [place], [place] (336) 349-2233    °Dr. Arfeen  (336) 349-4544   °[facility] [place]  [employer] [place] [facility] 1) [address], [place] °2) [address], [place] °3)  [address], [place] (336) 349-3220 °(336) 342-7768 ° °(336) 342-8140   °[place] [facility] (336) 342-1394 or (336) 342-3537 (After Hours)    ° ° °

## 2014-01-24 NOTE — ED Provider Notes (Signed)
Medical screening examination/treatment/procedure(s) were performed by non-physician practitioner and as supervising physician I was immediately available for consultation/collaboration.    Corianne Buccellato D Felicita Nuncio, MD 01/24/14 0700 

## 2014-06-30 ENCOUNTER — Encounter (HOSPITAL_COMMUNITY): Payer: Self-pay | Admitting: Emergency Medicine

## 2014-06-30 ENCOUNTER — Emergency Department (HOSPITAL_COMMUNITY)
Admission: EM | Admit: 2014-06-30 | Discharge: 2014-06-30 | Disposition: A | Discharge status: Home / Self Care | Payer: Self-pay | Attending: Emergency Medicine | Admitting: Emergency Medicine

## 2014-06-30 ENCOUNTER — Emergency Department (HOSPITAL_COMMUNITY): Payer: Self-pay

## 2014-06-30 DIAGNOSIS — M79674 Pain in right toe(s): Secondary | ICD-10-CM

## 2014-06-30 DIAGNOSIS — Z880 Allergy status to penicillin: Secondary | ICD-10-CM | POA: Insufficient documentation

## 2014-06-30 DIAGNOSIS — F17200 Nicotine dependence, unspecified, uncomplicated: Secondary | ICD-10-CM | POA: Insufficient documentation

## 2014-06-30 DIAGNOSIS — M25579 Pain in unspecified ankle and joints of unspecified foot: Secondary | ICD-10-CM | POA: Insufficient documentation

## 2014-06-30 DIAGNOSIS — M79609 Pain in unspecified limb: Secondary | ICD-10-CM | POA: Insufficient documentation

## 2014-06-30 DIAGNOSIS — M1990 Unspecified osteoarthritis, unspecified site: Secondary | ICD-10-CM

## 2014-06-30 DIAGNOSIS — Z8719 Personal history of other diseases of the digestive system: Secondary | ICD-10-CM | POA: Insufficient documentation

## 2014-06-30 MED ORDER — MELOXICAM 15 MG PO TABS: 15.0000 mg | ORAL_TABLET | Freq: Every day | ORAL | Status: DC

## 2014-06-30 NOTE — ED Provider Notes (Signed)
CSN: [id number]     Arrival date & time 06/30/14  1823 History  This chart was scribed for non-physician practitioner working with Mirian Mo, MD, by Modena Jansky, ED Scribe. This patient was seen in room WTR8/WTR8 and the patient's care was started at 8:41 PM.   Chief Complaint  Patient presents with  . Toe Pain   Patient is a 28 y.o. male presenting with toe pain. The history is provided by the patient. No language interpreter was used.  Toe Pain   HPI Comments: Mitchell Nolan is a 28 y.o. male with a hx of GERD who presents to the Emergency Department complaining of constant moderate toe pain that started 3 days ago. He states that he noticed pain at work that later on worsened. He states that he has pain and swelling in his right great toe and left second toe. He denies any injury to affected area. 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 He reports that he took two motrin and a shower without relief. He states that he has prior hx of injury to his left foot without surgery. He reports that his shoes have poor arch support. No other aggravating or alleviating factors. No other associated symptoms.   Past Medical History  Diagnosis Date  . GERD (gastroesophageal reflux disease)    History reviewed. No pertinent past surgical history. No family history on file. History  Substance Use Topics  . Smoking status: Current Every Day Smoker -- 0.50 packs/day    Types: Cigarettes  . Smokeless tobacco: Not on file  . Alcohol Use: Yes     Comment: occ    Review of Systems  Constitutional: Negative for fever and chills.  Neurological: Negative for weakness and numbness.  All other systems reviewed and are negative.   Allergies  Amoxicillin and Prilosec  Home Medications   Prior to Admission medications   Medication Sig Start Date End Date Taking? Authorizing Provider  cyclobenzaprine (FLEXERIL) 10 MG tablet Take 1 tablet (10 mg total) by mouth 2 (two) times daily as  needed for muscle spasms. 01/24/14   Junius Finner, PA-C  ibuprofen (ADVIL,MOTRIN) 200 MG tablet Take 200 mg by mouth every 6 (six) hours as needed for pain or headache.    Historical Provider, MD   BP 104/60  Pulse 65  Temp(Src) 98.4 F (36.9 C) (Oral)  Resp 18  SpO2 100% Physical Exam  Nursing note and vitals reviewed. Constitutional: He is oriented to person, place, and time. He appears well-developed and well-nourished. No distress.  HENT:  Head: Normocephalic.  Eyes: Conjunctivae are normal.  Cardiovascular: Normal rate and regular rhythm.   Pulmonary/Chest: Effort normal and breath sounds normal. No respiratory distress.  Musculoskeletal: He exhibits edema and tenderness.  There is mild swelling and tenderness to the right first MTP joint. Full passive range of motion. There is increased pain with passive dorsiflexion. Normal distal sensation capillary refill. Normal dorsal pedal pulses. No erythema of the skin or increased warmth. No other skin changes.  No significant swelling or pain to the left second toe. Range of motion appears full. Normal sensations and capillary refill. Normal skin.  Neurological: He is alert and oriented to person, place, and time.  Skin: Skin is warm. No erythema.  Psychiatric: He has a normal mood and affect. His behavior is normal.    ED Course  Procedures   DIAGNOSTIC STUDIES: Oxygen Saturation is 100% on RA, normal by my interpretation.    COORDINATION  OF CARE: 8:45 PM- patient seen and evaluated. X-rays reviewed and discussed with patient. No history of injury or trauma. No signs of fracture or dislocation on x-rays. No skin changes, erythema or increased warmth. No signs concerning for infection. Discussed treatment plan with rest, ice, compression and elevation. Pt advised of plan for treatment and pt agrees.   Imaging Review Dg Foot Complete Left  06/30/2014   CLINICAL DATA:  Bilateral foot pain, right greater than left.  EXAM: LEFT FOOT  - COMPLETE 3+ VIEW  COMPARISON:  None.  FINDINGS: There is no evidence of fracture or dislocation. There is no evidence of arthropathy or other focal bone abnormality. Soft tissues are unremarkable.  IMPRESSION: Negative.   Electronically Signed   By: Elige Ko   On: 06/30/2014 19:58   Dg Foot Complete Right  06/30/2014   CLINICAL DATA:  Bilateral foot pain.  Right greater than left.  EXAM: RIGHT FOOT COMPLETE - 3+ VIEW  COMPARISON:  None.  FINDINGS: There is no evidence of fracture or dislocation. There is no evidence of arthropathy or other focal bone abnormality. Soft tissues are unremarkable.  IMPRESSION: Negative.   Electronically Signed   By: Signa Kell M.D.   On: 06/30/2014 19:54     MDM   Final diagnoses:  Pain of toe of right foot  Arthritis   I personally performed the services described in this documentation, which was scribed in my presence. The recorded information has been reviewed and is accurate.    [name], PA-C 06/30/14 2107

## 2014-06-30 NOTE — ED Notes (Addendum)
Pt c/o right great toe swelling and pain and left 2nd toe pain. Denies injury to toes.

## 2014-06-30 NOTE — Discharge Instructions (Signed)
Your x-rays today do not show any concerning findings to explain your to pain. Use rest, ice, compression and elevation to reduce pain and swelling of your toe joints. Followup with a primary care provider or a podiatry specialist for continued evaluation and treatment.    Arthritis, Nonspecific Arthritis is pain, redness, warmth, or puffiness (inflammation) of a joint. The joint may be stiff or hurt when you move it. One or more joints may be affected. There are many types of arthritis. Your doctor may not know what type you have right away. The most common cause of arthritis is wear and tear on the joint (osteoarthritis). HOME CARE   Only take medicine as told by your doctor.  Rest the joint as much as possible.  Raise (elevate) your joint if it is puffy.  Use crutches if the painful joint is in your leg.  Drink enough fluids to keep your pee (urine) clear or pale yellow.  Follow your doctor's diet instructions.  Use cold packs for very bad joint pain for 10 to 15 minutes every hour. Ask your doctor if it is okay for you to use hot packs.  Exercise as told by your doctor.  Take a warm shower if you have stiffness in the morning.  Move your sore joints throughout the day. GET HELP RIGHT AWAY IF:   You have a fever.  You have very bad joint pain, puffiness, or redness.  You have many joints that are painful and puffy.  You are not getting better with treatment.  You have very bad back pain or leg weakness.  You cannot control when you poop (bowel movement) or pee (urinate).  You do not feel better in 24 hours or are getting worse.  You are having side effects from your medicine. MAKE SURE YOU:   Understand these instructions.  Will watch your condition.  Will get help right away if you are not doing well or get worse. Document Released: 01/18/2010 Document Revised: 04/24/2012 Document Reviewed: 01/18/2010 [facility] Patient Information 2015 [place], [place]. This  information is not intended to replace advice given to you by your health care provider. Make sure you discuss any questions you have with your health care provider.

## 2014-07-01 NOTE — ED Provider Notes (Signed)
Medical screening examination/treatment/procedure(s) were performed by non-physician practitioner and as supervising physician I was immediately available for consultation/collaboration.   EKG Interpretation None        Mirian Mo, MD 07/01/14 [phone]

## 2014-08-07 ENCOUNTER — Encounter (HOSPITAL_COMMUNITY): Payer: Self-pay | Admitting: Emergency Medicine

## 2014-08-07 ENCOUNTER — Emergency Department (HOSPITAL_COMMUNITY)
Admission: EM | Admit: 2014-08-07 | Discharge: 2014-08-07 | Disposition: A | Discharge status: Home / Self Care | Payer: Self-pay | Attending: Emergency Medicine | Admitting: Emergency Medicine

## 2014-08-07 DIAGNOSIS — K089 Disorder of teeth and supporting structures, unspecified: Secondary | ICD-10-CM | POA: Insufficient documentation

## 2014-08-07 DIAGNOSIS — K047 Periapical abscess without sinus: Secondary | ICD-10-CM

## 2014-08-07 DIAGNOSIS — K044 Acute apical periodontitis of pulpal origin: Secondary | ICD-10-CM | POA: Insufficient documentation

## 2014-08-07 DIAGNOSIS — Z720 Tobacco use: Secondary | ICD-10-CM | POA: Insufficient documentation

## 2014-08-07 DIAGNOSIS — K0889 Other specified disorders of teeth and supporting structures: Secondary | ICD-10-CM

## 2014-08-07 DIAGNOSIS — Z791 Long term (current) use of non-steroidal anti-inflammatories (NSAID): Secondary | ICD-10-CM | POA: Insufficient documentation

## 2014-08-07 DIAGNOSIS — Z8719 Personal history of other diseases of the digestive system: Secondary | ICD-10-CM | POA: Insufficient documentation

## 2014-08-07 DIAGNOSIS — K0856 Poor aesthetic of existing restoration of tooth: Secondary | ICD-10-CM | POA: Insufficient documentation

## 2014-08-07 DIAGNOSIS — Z880 Allergy status to penicillin: Secondary | ICD-10-CM | POA: Insufficient documentation

## 2014-08-07 MED ORDER — TRAMADOL HCL 50 MG PO TABS: 50.0000 mg | ORAL_TABLET | Freq: Four times a day (QID) | ORAL | Status: DC | PRN

## 2014-08-07 MED ORDER — CLINDAMYCIN HCL 300 MG PO CAPS: 300.0000 mg | ORAL_CAPSULE | Freq: Four times a day (QID) | ORAL | Status: DC

## 2014-08-07 MED ORDER — IBUPROFEN 800 MG PO TABS: 800.0000 mg | ORAL_TABLET | Freq: Three times a day (TID) | ORAL | Status: DC

## 2014-08-07 NOTE — ED Provider Notes (Signed)
Medical screening examination/treatment/procedure(s) were performed by non-physician practitioner and as supervising physician I was immediately available for consultation/collaboration.  Dasean Brow, MD 08/07/14 0951 

## 2014-08-07 NOTE — Discharge Instructions (Signed)
Take antibiotic to completion. Follow up with the dentist. Take tramadol as directed as needed for pain.  Dental Pain A tooth ache may be caused by cavities (tooth decay). Cavities expose the nerve of the tooth to air and hot or cold temperatures. It may come from an infection or abscess (also called a boil or furuncle) around your tooth. It is also often caused by dental caries (tooth decay). This causes the pain you are having. DIAGNOSIS  Your caregiver can diagnose this problem by exam. TREATMENT   If caused by an infection, it may be treated with medications which kill germs (antibiotics) and pain medications as prescribed by your caregiver. Take medications as directed.  Only take over-the-counter or prescription medicines for pain, discomfort, or fever as directed by your caregiver.  Whether the tooth ache today is caused by infection or dental disease, you should see your dentist as soon as possible for further care. SEEK MEDICAL CARE IF: The exam and treatment you received today has been provided on an emergency basis only. This is not a substitute for complete medical or dental care. If your problem worsens or new problems (symptoms) appear, and you are unable to meet with your dentist, call or return to this location. SEEK IMMEDIATE MEDICAL CARE IF:   You have a fever.  You develop redness and swelling of your face, jaw, or neck.  You are unable to open your mouth.  You have severe pain uncontrolled by pain medicine. MAKE SURE YOU:   Understand these instructions.  Will watch your condition.  Will get help right away if you are not doing well or get worse. Document Released: 10/24/2005 Document Revised: 01/16/2012 Document Reviewed: 06/11/2008 [facility] Patient Information 2015 [place]. This information is not intended to replace advice given to you by your health care provider. Make sure you discuss any questions you have with your health care provider.

## 2014-08-07 NOTE — ED Provider Notes (Signed)
CSN: [id number]     Arrival date & time 08/07/14  0803 History   First MD Initiated Contact with Patient 08/07/14 [phone]     Chief Complaint  Patient presents with  . Dental Pain     (Consider location/radiation/quality/duration/timing/severity/associated sxs/prior Treatment) HPI Comments: Pt is a 28 y/o male who presents to the emergency department complaining of right-sided dental pain worsening over the past 3 days. Patient reports he's had a broken tooth "for a while", however the pain has worsened over the past 3 days. States he is having difficulty chewing on the right side of his mouth. Denies fever, chills or facial swelling. He has tried taking extra strength Tylenol with only mild, temporary relief. He does not have a dentist.  Patient is a 28 y.o. male presenting with tooth pain. The history is provided by the patient.  Dental Pain   Past Medical History  Diagnosis Date  . GERD (gastroesophageal reflux disease)    History reviewed. No pertinent past surgical history. History reviewed. No pertinent family history. History  Substance Use Topics  . Smoking status: Current Every Day Smoker -- 0.50 packs/day    Types: Cigarettes  . Smokeless tobacco: Not on file  . Alcohol Use: Yes     Comment: occ    Review of Systems  Constitutional: Negative.   HENT: Positive for dental problem.   Respiratory: Negative.   Cardiovascular: Negative.   Neurological: Negative.       Allergies  Amoxicillin and Prilosec  Home Medications   Prior to Admission medications   Medication Sig Start Date End Date Taking? Authorizing Provider  cyclobenzaprine (FLEXERIL) 10 MG tablet Take 1 tablet (10 mg total) by mouth 2 (two) times daily as needed for muscle spasms. 01/24/14   Junius Finner, PA-C  ibuprofen (ADVIL,MOTRIN) 200 MG tablet Take 200 mg by mouth every 6 (six) hours as needed for pain or headache.    Historical Provider, MD  ibuprofen (ADVIL,MOTRIN) 800 MG tablet Take 1 tablet  (800 mg total) by mouth 3 (three) times daily. 08/07/14   Trevor Mace, PA-C  meloxicam (MOBIC) 15 MG tablet Take 1 tablet (15 mg total) by mouth daily. 06/30/14   Phill Mutter Dammen, PA-C   BP 120/81  Pulse 68  Temp(Src) 97.8 F (36.6 C) (Oral)  Resp 18  SpO2 97% Physical Exam  Nursing note and vitals reviewed. Constitutional: He is oriented to person, place, and time. He appears well-developed and well-nourished. No distress.  HENT:  Head: Normocephalic and atraumatic.  Mouth/Throat:    Poor dentition.  Eyes: Conjunctivae and EOM are normal.  Neck: Normal range of motion. Neck supple.  Cardiovascular: Normal rate, regular rhythm and normal heart sounds.   Pulmonary/Chest: Effort normal and breath sounds normal.  Musculoskeletal: Normal range of motion. He exhibits no edema.  Neurological: He is alert and oriented to person, place, and time.  Skin: Skin is warm and dry.  Psychiatric: He has a normal mood and affect. His behavior is normal.    ED Course  Procedures (including critical care time) Labs Review Labs Reviewed - No data to display  Imaging Review No results found.   EKG Interpretation None      MDM   Final diagnoses:  Right Achilles tendinitis    Dental pain associated with dental infection. No evidence of dental abscess. Patient is afebrile, non toxic appearing and swallowing secretions well. I gave patient referral to dentist and stressed the importance of dental follow up for ultimate  management of dental pain. I will also give clinda (allergy to PCN) and pain control. Patient voices understanding and is agreeable to plan.   Trevor MaceRobyn M Albert, PA-C 08/07/14 [phone]

## 2014-08-07 NOTE — ED Notes (Signed)
Pt c/o right upper dental pain from broken tooth x 3 days

## 2014-09-22 ENCOUNTER — Emergency Department (HOSPITAL_COMMUNITY)
Admission: EM | Admit: 2014-09-22 | Discharge: 2014-09-22 | Disposition: A | Discharge status: Home / Self Care | Payer: Self-pay | Attending: Emergency Medicine | Admitting: Emergency Medicine

## 2014-09-22 ENCOUNTER — Encounter (HOSPITAL_COMMUNITY): Payer: Self-pay

## 2014-09-22 DIAGNOSIS — Z8719 Personal history of other diseases of the digestive system: Secondary | ICD-10-CM | POA: Insufficient documentation

## 2014-09-22 DIAGNOSIS — K047 Periapical abscess without sinus: Secondary | ICD-10-CM | POA: Insufficient documentation

## 2014-09-22 DIAGNOSIS — Z720 Tobacco use: Secondary | ICD-10-CM | POA: Insufficient documentation

## 2014-09-22 DIAGNOSIS — Z880 Allergy status to penicillin: Secondary | ICD-10-CM | POA: Insufficient documentation

## 2014-09-22 DIAGNOSIS — Z792 Long term (current) use of antibiotics: Secondary | ICD-10-CM | POA: Insufficient documentation

## 2014-09-22 MED ORDER — CLINDAMYCIN HCL 150 MG PO CAPS: 300.0000 mg | ORAL_CAPSULE | Freq: Four times a day (QID) | ORAL | Status: DC

## 2014-09-22 MED ORDER — HYDROCODONE-ACETAMINOPHEN 5-325 MG PO TABS: 1.0000 | ORAL_TABLET | ORAL | Status: DC | PRN

## 2014-09-22 MED ORDER — IBUPROFEN 800 MG PO TABS: 800.0000 mg | ORAL_TABLET | Freq: Three times a day (TID) | ORAL | Status: DC | PRN

## 2014-09-22 MED ORDER — HYDROCODONE-ACETAMINOPHEN 5-325 MG PO TABS
1.0000 | ORAL_TABLET | Freq: Once | ORAL | Status: AC
  Administered 2014-09-22: 1 via ORAL

## 2014-09-22 NOTE — Discharge Instructions (Signed)
Read the information below.  Use the prescribed medication as directed.  Please discuss all new medications with your pharmacist.  Do not take additional tylenol while taking the prescribed pain medication to avoid overdose.  You may return to the Emergency Department at any time for worsening condition or any new symptoms that concern you.  Please call the dentist listed above within 48 hours to schedule a close follow up appointment.  If you develop fevers, swelling in your face, difficulty swallowing or breathing, return to the ER immediately for a recheck.   ° ° °Dental Abscess °A dental abscess is a collection of infected fluid (pus) from a bacterial infection in the inner part of the tooth (pulp). It usually occurs at the end of the tooth's root.  °CAUSES  °· Severe tooth decay. °· Trauma to the tooth that allows bacteria to enter into the pulp, such as a broken or chipped tooth. °SYMPTOMS  °· Severe pain in and around the infected tooth. °· Swelling and redness around the abscessed tooth or in the mouth or face. °· Tenderness. °· Pus drainage. °· Bad breath. °· Bitter taste in the mouth. °· Difficulty swallowing. °· Difficulty opening the mouth. °· Nausea. °· Vomiting. °· Chills. °· Swollen neck glands. °DIAGNOSIS  °· A medical and dental history will be taken. °· An examination will be performed by tapping on the abscessed tooth. °· X-rays may be taken of the tooth to identify the abscess. °TREATMENT °The goal of treatment is to eliminate the infection. You may be prescribed antibiotic medicine to stop the infection from spreading. A root canal may be performed to save the tooth. If the tooth cannot be saved, it may be pulled (extracted) and the abscess may be drained.  °HOME CARE INSTRUCTIONS °· Only take over-the-counter or prescription medicines for pain, fever, or discomfort as directed by your caregiver. °· Rinse your mouth (gargle) often with salt water (¼ tsp salt in 8 oz [250 ml] of warm water) to  relieve pain or swelling. °· Do not drive after taking pain medicine (narcotics). °· Do not apply heat to the outside of your face. °· Return to your dentist for further treatment as directed. °SEEK MEDICAL CARE IF: °· Your pain is not helped by medicine. °· Your pain is getting worse instead of better. °SEEK IMMEDIATE MEDICAL CARE IF: °· You have a fever or persistent symptoms for more than 2-3 days. °· You have a fever and your symptoms suddenly get worse. °· You have chills or a very bad headache. °· You have problems breathing or swallowing. °· You have trouble opening your mouth. °· You have swelling in the neck or around the eye. °Document Released: 10/24/2005 Document Revised: 07/18/2012 Document Reviewed: 02/01/2011 °ExitCare® Patient Information ©2015 [employer]. This information is not intended to replace advice given to you by your health care provider. Make sure you discuss any questions you have with your health care provider. ° ° °Emergency Department Resource Guide °1) Find a Doctor and Pay Out of Pocket °Although you won't have to find out who is covered by your insurance plan, it is a good idea to ask around and get recommendations. You will then need to call the office and see if the doctor you have chosen will accept you as a new patient and what types of options they offer for patients who are self-pay. Some doctors offer discounts or will set up payment plans for their patients who do not have insurance, but you   will need to ask so you aren't surprised when you get to your appointment. ° °2) Contact Your Local Health Department °Not all health departments have doctors that can see patients for sick visits, but many do, so it is worth a call to see if yours does. If you don't know where your local health department is, you can check in your phone book. The CDC also has a tool to help you locate your state's health department, and many state websites also have listings of all of their local  health departments. ° °3) Find a Walk-in Clinic °If your illness is not likely to be very severe or complicated, you may want to try a walk in clinic. These are popping up all over the country in pharmacies, drugstores, and shopping centers. They're usually staffed by nurse practitioners or physician assistants that have been trained to treat common illnesses and complaints. They're usually fairly quick and inexpensive. However, if you have serious medical issues or chronic medical problems, these are probably not your best option. ° °No Primary Care Doctor: °- Call [facility] at  832-8000 - they can help you locate a primary care doctor that  accepts your insurance, provides certain services, etc. °- Physician Referral Service- 1-800-533-3463 ° °Chronic Pain Problems: °Organization         Address  Phone   Notes  °[facility]  (336) 297-2271 Patients need to be referred by their primary care doctor.  ° °Medication Assistance: °Organization         Address  Phone   Notes  °[place] [facility] [address] °[place], [place] [address] (336) 641-8030 --Must be a resident of [place] °-- Must have NO insurance coverage whatsoever (no Medicaid/ Medicare, etc.) °-- The pt. MUST have a primary care doctor that directs their care regularly and follows them in the community °  °[facility]  (866) 331-1348   °[employer]  (888) 892-1162   ° °Agencies that provide inexpensive medical care: °Organization         Address  Phone   Notes  °[facility]  (336) 832-8035   ° Internal Medicine    (336) 832-7272   °[facility] [address] °[place], [place] [address] (336) 832-4777   °[facility] [address], °[place] (336) 271-4999   °[facility]    (336) 373-0678   °[facility]    (336) 272-1050   °[facility] ° [address], [place] Phone:   (336) 832-4444, Fax:  (336) 832-4440 Hours of Operation:  9 am - 6 pm, M-F.  Also accepts Medicaid/Medicare and self-pay.  °[facility] ° [address], [place] Phone: (336) 832-3150, Fax: (336) 832-3151. Hours of Operation:  8:30 am - 5:30 pm, M-F.  Also accepts Medicaid and self-pay.  °[facility] [place] [address], [place] Phone: (336) 878-6027   °[facility] [address], [place], [place] (336)723-1848, Ext. 123 Mondays & Thursdays: 7-9 AM.  First 15 patients are seen on a first come, first serve basis. °  ° °Medicaid-accepting [place] Providers: ° °Organization         Address  Phone   Notes  °[facility] [address], [place] (336) 641-2100 Also accepts self-pay patients.  °[facility] [address],   [place] ° (336) 856-9996   °[facility] [address], [place] (336) 288-8857   °[facility] [address] [place] [address], [place] (336) 299-7000   °Veita Bland [address], [place]  ° (336) 373-1557 Only accepts  Access Medicaid patients after they have their name applied to their card.  ° °Self-Pay (no insurance) in [place]: ° °Organization         Address  Phone   Notes  °Sickle Cell Patients, [facility] [address], [place] (336) 832-1970   °[facility] [address], [place] (336) 832-4400   °[facility] ° [address], [place] (336) 992-4800   °[facility]/Dr. Osei-Bonsu ° [address] [place] [address], [place] or [address], [place] (336) 841-8500 Phone number for both [place] and [place] locations is the same.  °[facility] [address], [place] (336) 299-0000   °[facility] [address] [place] [address], [place] or [address] (336)  852-7530 °(336) 878-2260   °[facility] [address], [place] (336) 350-1642, phone; (336) 294-5005, fax Sees patients 1st and 3rd Saturday of every month.  Must not qualify for public or private insurance (i.e. Medicaid, Medicare, [place] Health Choice, Veterans' Benefits) • Household income should be no more than 200% of the poverty level •The clinic cannot treat you if you are pregnant or think you are pregnant • Sexually transmitted diseases are not treated at the clinic.  ° ° °Dental Care: °Organization         Address  Phone  Notes  °[facility] [address], [place] (336) 641-6152 Accepts children up to age 21 who are enrolled in Medicaid or [place] Health Choice; pregnant women with a Medicaid card; and children who have applied for Medicaid or [place] Health Choice, but were declined, whose parents can pay a reduced fee at time of service.  °[facility] [place]  [address], [place] (336) 641-7733 Accepts children up to age 21 who are enrolled in Medicaid or [place] Health Choice; pregnant women with a Medicaid card; and children who have applied for Medicaid or [place] Health Choice, but were declined, whose parents can pay a reduced fee at time of service.  °[facility] ° [address], [place] (336) 641-4533 Patients are seen by appointment only. Walk-ins are not accepted. [facility] will see patients 18 years of age and older. °Monday - Tuesday (8am-5pm) °Most Wednesdays (8:30-5pm) °$30 per visit, cash only  °[facility] ° [address], [place] (336) 641-4533 Patients are seen by appointment only. Walk-ins are not accepted. [facility] will see patients 18 years of age and older. °One Wednesday Evening (Monthly: Volunteer Based).  $30 per visit, cash only  °[facility]  (919) 537-3737 for adults;  Children under age 4, call Graduate Pediatric Dentistry at (919) [phone]. Children aged 4-14, please call (919) 537-3737 to request a pediatric application. ° Dental services are provided in all areas of dental care including fillings, crowns and bridges, complete and partial dentures, implants, gum treatment, root canals, and extractions. Preventive care is also provided. Treatment is provided to both adults and children. °Patients are selected via a lottery and   there is often a waiting list. °  °[facility] [address], °[place] ° (336) 763-8833 [email or web address] °  °[facility] [address], [place], [place] (336)723-1848, Ext. 123 Second and Fourth Thursday of each month, opens at 6:30 AM; Clinic ends at 9 AM.  Patients are seen on a first-come first-served basis, and a limited number are seen during each clinic.  ° °[facility] ° [address], [place], [place] (336) 723-7904   Eligibility Requirements °You must have lived in [place], [place], or [place] counties for at least the last three months. °  You cannot be eligible for state or federal sponsored healthcare insurance, including Veterans Administration, Medicaid, or Medicare. °  You generally cannot be eligible for healthcare insurance through your employer.  °  How to apply: °Eligibility screenings are held every Tuesday and Wednesday afternoon from 1:00 pm until 4:00 pm. You do not need an appointment for the interview!  °[facility] [address], [place], [place] 336-631-2330   °[facility]  336-342-8273   °[facility]  336-703-3100   °[facility]  336-570-6415   ° °Behavioral Health Resources in the Community: °Intensive Outpatient Programs °Organization         Address  Phone  Notes  °[facility] [address], [place], [place] 336-878-6098   °[facility] [address], [place], [place] 336-832-9800   °[facility] [address], [place], [place] ° 336-882-2125   °[facility] [address],  °[place], [place] 1-800-853-5163 or 336-641-4981   °Substance Abuse Resources °Organization         Address  Phone  Notes  °[facility]  336-882-2125   °[facility]  336-784-9470   °[facility]  336-285-9073   °[facility]  336-845-3988   °Residential & Outpatient Substance Abuse Program  1-800-659-3381   °Psychological Services °Organization         Address  Phone  Notes  °[facility]  336- 832-9600   °[facility]  336- 378-7881   °[facility] [address], [place] 1-800-853-5163 or 336-641-4981   ° °Mobile Crisis Teams °Organization         Address  Phone  Notes  °[facility], Mobile Crisis Care Unit  1-877-626-1772   °Assertive °[facility] ° [address] [place], [place] 336-834-9664   °Sharon DeEsch [address] °[place] 336-554-5454   ° °Self-Help/Support Groups °Organization         Address  Phone             Notes  °[facility] - variety of support groups  336- 373-1402 Call for more information  °Narcotics Anonymous (NA), [facility] [address], °[place]  2 meetings at this location  ° °Residential Treatment Programs °Organization         Address  Phone  Notes  °[facility] [address],    °[place]  1-866-801-8205   °[facility] ° [address], [place], [place] 704-293-8524   °[facility] [address], [place] 336-845-3988 Admissions: 8am-3pm M-F  °[facility] [address]    °[place], [place] 336-841-1104   °[facility] [address], , [place] 336-379-7146   °  [facility] [address]  °[place], [place] 336-285-9073   °[facility] - Intensive  Outpatient [address], [place], [place] 336-852-3033   °[facility] [address]  °[place], [place] 1-877-615-2722 or 336-784-9470   °[facility] [address] [place], [place] 336-227-7417 Accepts Medicaid  °[facility] [address]  °[place] 1-800-659-3381 Substance Abuse/Addiction Treatment  ° °[place] Behavioral Health Resources °Organization         Address  Phone  Notes  °[facility]  (888) 581-9988   °Julie Brannon, PhD [address] [place], [place]   (336) 349-5553 or (336) 951-0000   °[facility]   [address] °[place], [place] (336) 349-4454   °[facility] [address], [place], [place] (336) 342-8316 Insurance/Medicaid/sponsorship through [facility]  °[facility] [address]                                    [place], [place] (336) 342-8316 Therapy/tele-psych/case  °[facility] [address]  ° [place], [place] (336) 349-2233    °Dr. Arfeen  (336) 349-4544   °[facility] [place]  [employer] [place] [facility] 1) [address], [place] °2) [address], [place] °3)  [address], [place] (336) 349-3220 °(336) 342-7768 ° °(336) 342-8140   °[place] [facility] (336) 342-1394 or (336) 342-3537 (After Hours)    ° ° ° ° °

## 2014-09-22 NOTE — ED Provider Notes (Signed)
CSN: [id number]     Arrival date & time 09/22/14  1056 History  This chart was scribed for non-physician practitioner, Trixie DredgeEmily Maayan Jenning, PA-C working with Richardean Canalavid H Yao, MD by Greggory StallionKayla Andersen, ED scribe. This patient was seen in room WTR7/WTR7 and the patient's care was started at 11:55 AM.     Chief Complaint  Patient presents with  . Dental Pain  . Facial Swelling   The history is provided by the patient. No language interpreter was used.    HPI Comments: Mitchell Nolan is a 28 y.o. male who presents to the Emergency Department complaining of throbbing, aching right upper dental pain that started 3 days ago. Reports associated facial swelling that started 2 days ago and worsened yesterday. Rates pain 6/10. Pt was evaluated for the same 2 months ago and was given antibiotics with relief of symptoms. He was unable to follow up with a dentist to have the tooth extracted due to work. States he was chewing a piece of gum when the tooth started hurting again and thinks it chipped further. He has taken ibuprofen with some relief. Denies fever, chills, sore throat, trouble swallowing, difficulty breathing, body aches.   Past Medical History  Diagnosis Date  . GERD (gastroesophageal reflux disease)    History reviewed. No pertinent past surgical history. History reviewed. No pertinent family history. History  Substance Use Topics  . Smoking status: Current Every Day Smoker -- 0.50 packs/day    Types: Cigarettes  . Smokeless tobacco: Not on file  . Alcohol Use: Yes     Comment: occ    Review of Systems  Constitutional: Negative for fever and chills.  HENT: Positive for dental problem and facial swelling. Negative for sore throat and trouble swallowing.   Musculoskeletal: Negative for myalgias.  All other systems reviewed and are negative.  Allergies  Amoxicillin and Prilosec  Home Medications   Prior to Admission medications   Medication Sig Start Date End Date Taking? Authorizing Provider   acetaminophen (TYLENOL) 500 MG tablet Take 1,000 mg by mouth every 6 (six) hours as needed (for pain).    Historical Provider, MD  clindamycin (CLEOCIN) 300 MG capsule Take 1 capsule (300 mg total) by mouth 4 (four) times daily. X 7 days 08/07/14   Kathrynn Speedobyn M Hess, PA-C  ibuprofen (ADVIL,MOTRIN) 200 MG tablet Take 200 mg by mouth every 6 (six) hours as needed for pain or headache.    Historical Provider, MD  traMADol (ULTRAM) 50 MG tablet Take 1 tablet (50 mg total) by mouth every 6 (six) hours as needed. 08/07/14   Robyn M Hess, PA-C   BP 126/78 mmHg  Pulse 76  Temp(Src) 98 F (36.7 C) (Oral)  Resp 16  SpO2 97%   Physical Exam  Constitutional: He appears well-developed and well-nourished. No distress.  HENT:  Head: Normocephalic and atraumatic.  Mouth/Throat: Uvula is midline, oropharynx is clear and moist and mucous membranes are normal.  Right sided facial swelling. Oropharynx clear and symmetric. 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 Adjacent swelling.   Neck: Neck supple.  Cardiovascular: Normal rate and regular rhythm.   Pulmonary/Chest: Effort normal and breath sounds normal. No stridor.  Lymphadenopathy:  Right submandibular lymph node tender. No other anterior cervical lymphadenopathy.  Neurological: He is alert.  Skin: He is not diaphoretic.  Nursing note and vitals reviewed.   ED Course  Procedures (including critical care time)  DIAGNOSTIC STUDIES: Oxygen Saturation is 97% on RA,  normal by my interpretation.    COORDINATION OF CARE: 11:58 AM-Discussed treatment plan which includes an antibiotic and pain medication with pt at bedside and pt agreed to plan. Will give pt a dental referral and advised him to follow up.   Labs Review Labs Reviewed - No data to display  Imaging Review No results found.   EKG Interpretation None      MDM   Final diagnoses:  Dental abscess    Afebrile, nontoxic patient with new dental pain.   Right upper molar with obvious abscess.  No airway concerns.  No e/o Ludwig's angina.  D/C home with antibiotic, pain medication and dental follow up.  Discussed findings, treatment, and follow up  with patient.  Pt given return precautions.  Pt verbalizes understanding and agrees with plan.       I personally performed the services described in this documentation, which was scribed in my presence. The recorded information has been reviewed and is accurate.  Trixie Dredgemily Adiya Selmer, PA-C 09/22/14 1449  Richardean Canalavid H Yao, MD 09/22/14 [phone]

## 2014-09-22 NOTE — ED Notes (Signed)
Per pt, treated 2 months ago for dental pain.  Unable to do follow up to remove tooth.  Chewing gum 3 days ago and noted pain to tooth.  Tooth appeared to have chipped further.  Now with pain and swelling to rt face/jaw.

## 2015-02-21 IMAGING — CR DG FOOT COMPLETE 3+V*R*
3 series · 3 of 3 positions shown · non-contrast
Comparison: None.

CLINICAL DATA: Bilateral foot pain.  Right greater than left.

EXAM:
RIGHT FOOT COMPLETE - 3+ VIEW

[x foot ap right]
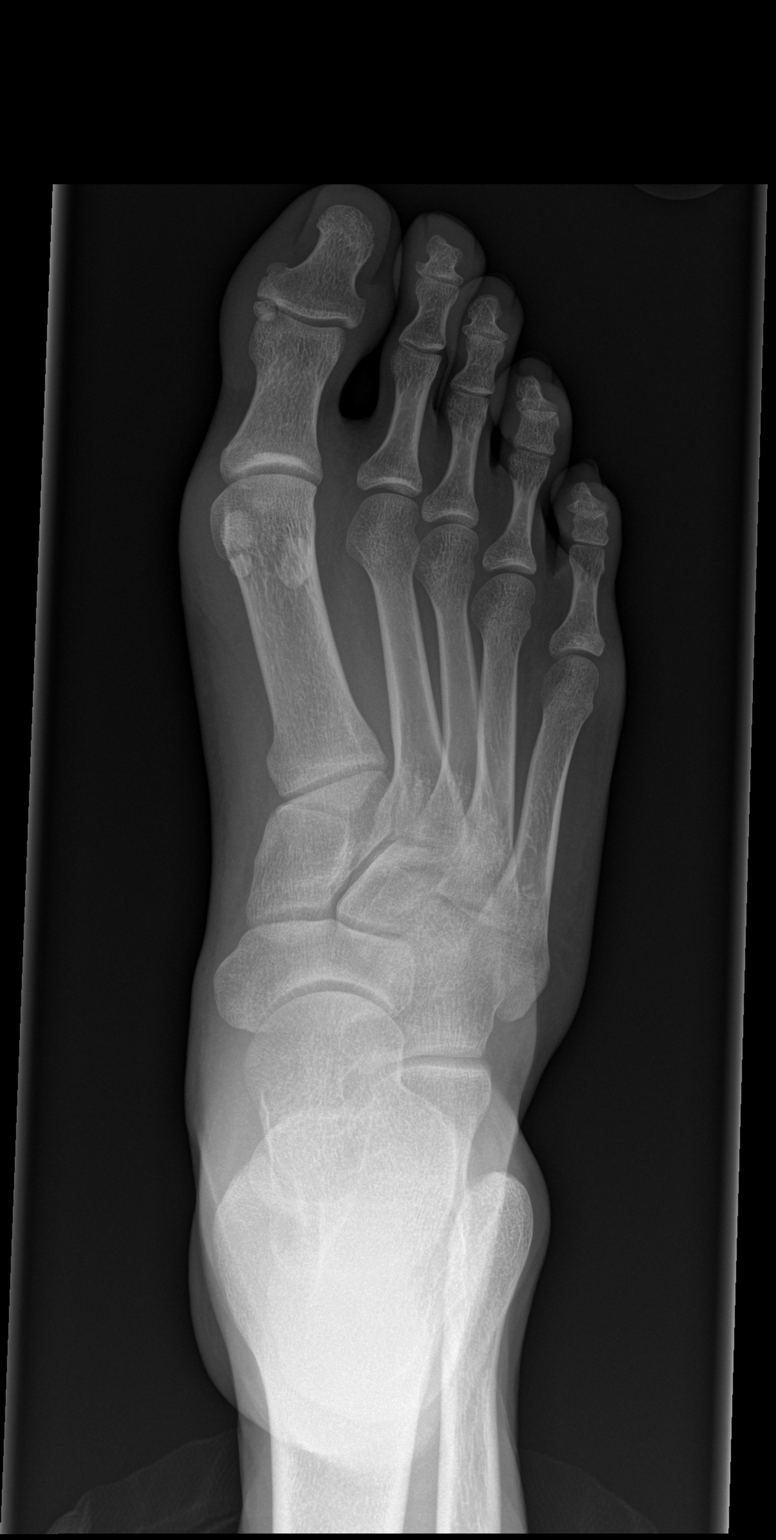

[x foot obl right]
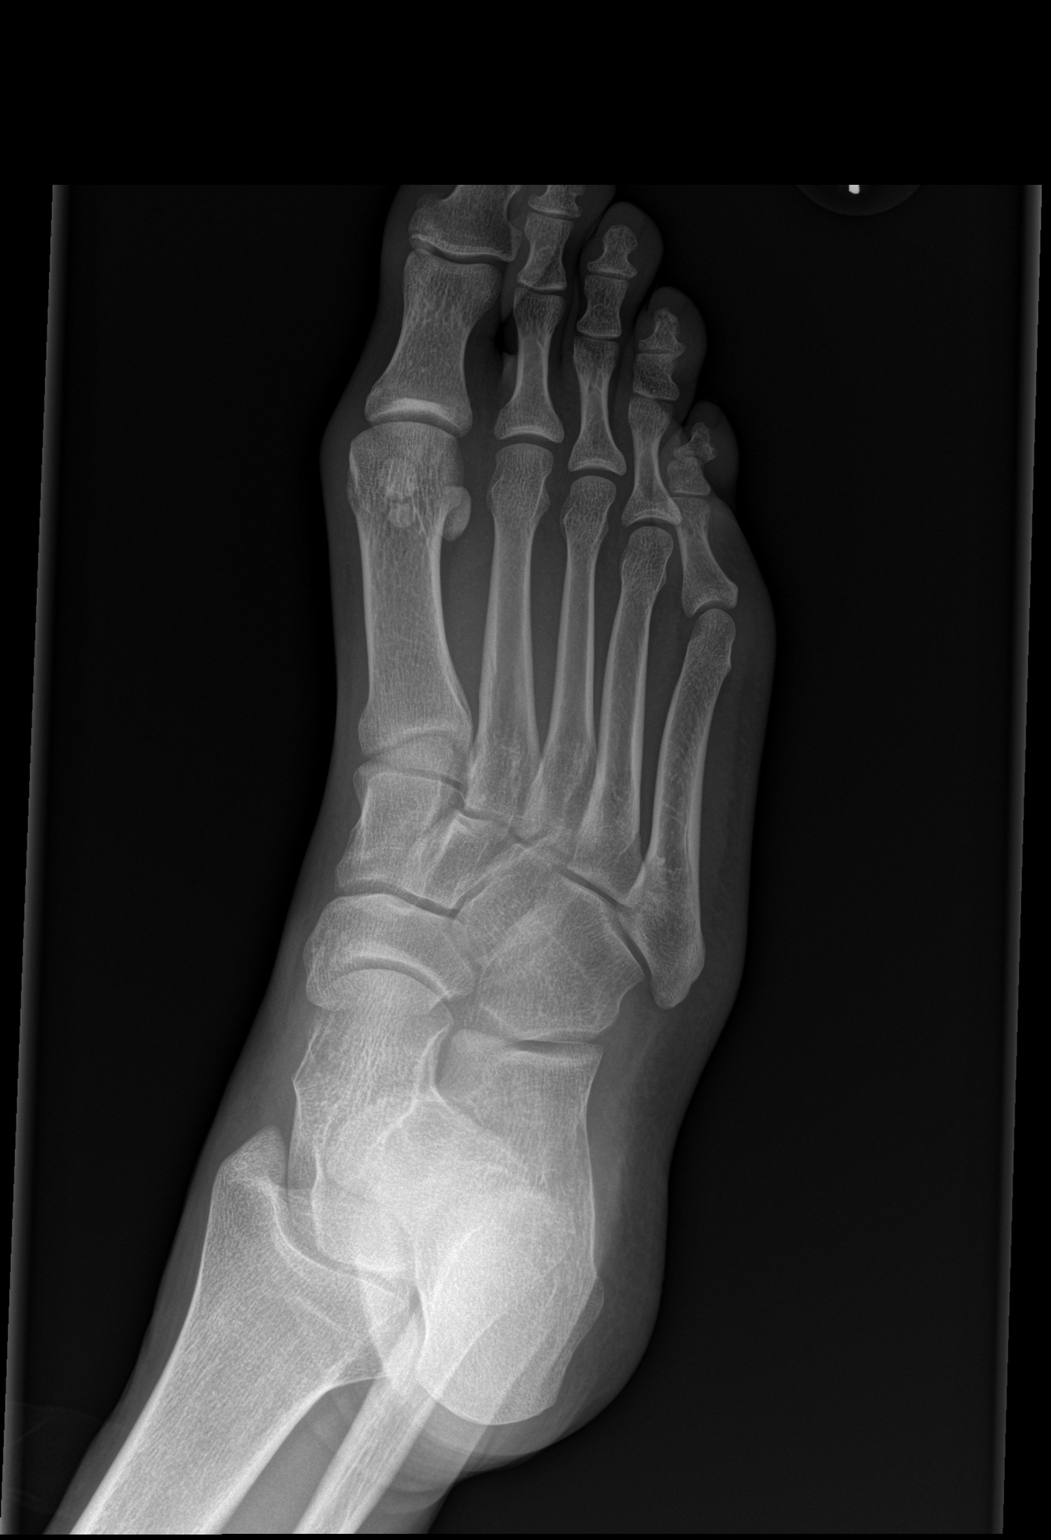

[x foot lat right]
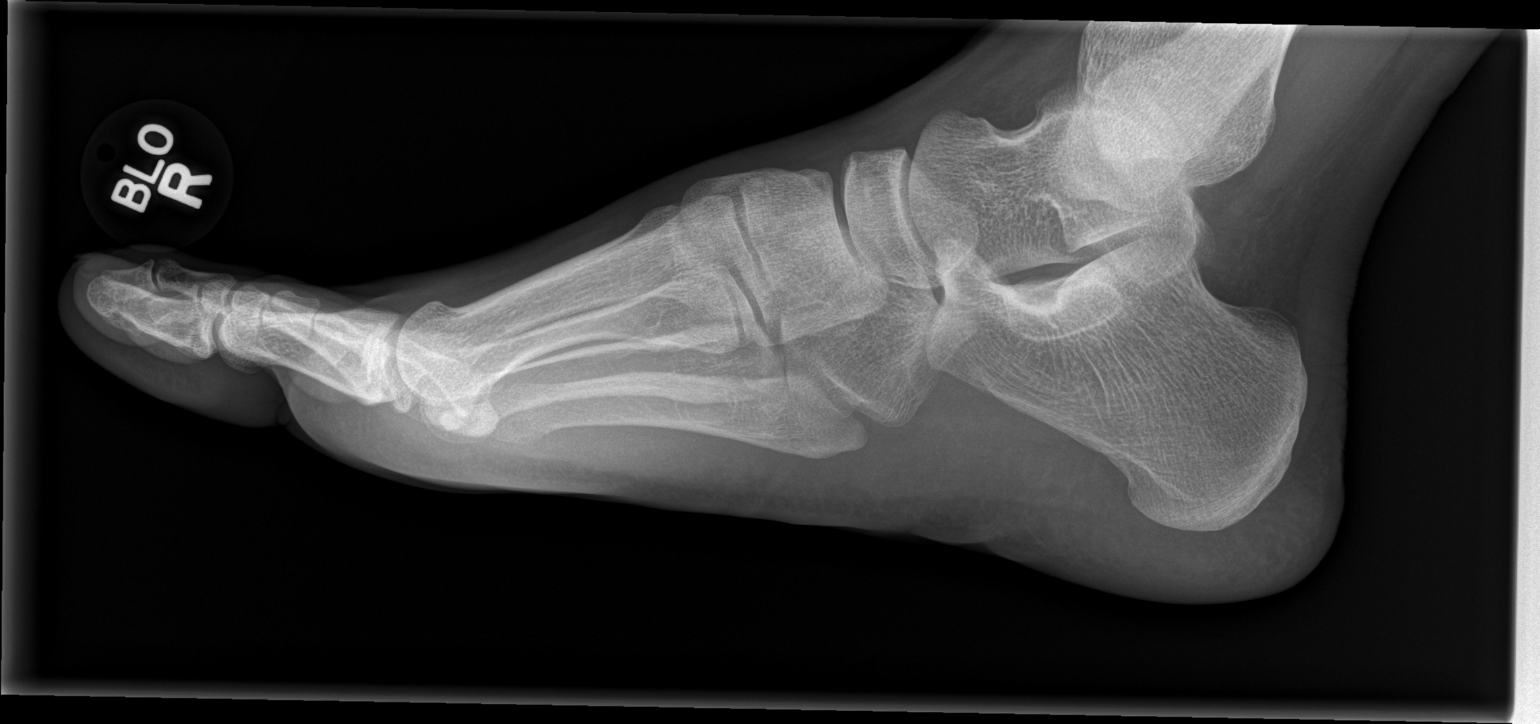

[3 of 3 positions shown; findings below may reference images not displayed]

FINDINGS: There is no evidence of fracture or dislocation. There is no
evidence of arthropathy or other focal bone abnormality. Soft
tissues are unremarkable.
IMPRESSION: Negative.

## 2016-10-07 ENCOUNTER — Encounter (HOSPITAL_COMMUNITY): Payer: Self-pay | Admitting: Emergency Medicine

## 2016-10-07 ENCOUNTER — Emergency Department (HOSPITAL_COMMUNITY)
Admission: EM | Admit: 2016-10-07 | Discharge: 2016-10-07 | Disposition: A | Discharge status: Home / Self Care | Payer: Self-pay | Attending: Emergency Medicine | Admitting: Emergency Medicine

## 2016-10-07 DIAGNOSIS — K0889 Other specified disorders of teeth and supporting structures: Secondary | ICD-10-CM | POA: Insufficient documentation

## 2016-10-07 DIAGNOSIS — F17210 Nicotine dependence, cigarettes, uncomplicated: Secondary | ICD-10-CM | POA: Insufficient documentation

## 2016-10-07 MED ORDER — BUPIVACAINE-EPINEPHRINE (PF) 0.5% -1:200000 IJ SOLN
1.8000 mL | Freq: Once | INTRAMUSCULAR | Status: AC
  Administered 2016-10-07: 1.8 mL
  Filled 2016-10-07: qty 1.8

## 2016-10-07 MED ORDER — CLINDAMYCIN HCL 150 MG PO CAPS
300.0000 mg | ORAL_CAPSULE | Freq: Three times a day (TID) | ORAL | 0 refills | Status: DC
Start: 2016-10-07 — End: 2019-05-24

## 2016-10-07 MED ORDER — TRAMADOL HCL 50 MG PO TABS
50.0000 mg | ORAL_TABLET | Freq: Once | ORAL | Status: AC
  Administered 2016-10-07: 50 mg via ORAL
  Filled 2016-10-07: qty 1

## 2016-10-07 NOTE — ED Triage Notes (Signed)
Pt. reports worsening left upper molar pain onset this morning unrelieved by OTC pain  medication .

## 2016-10-07 NOTE — Discharge Instructions (Signed)
Please continue to take Tylenol and ibuprofen for your dental pain. ED to follow-up with a dentist. I have provided you with a dental resource guide. Please return to the ED if you develop facial swelling or fevers or for any other reason.

## 2016-10-07 NOTE — ED Provider Notes (Signed)
[facility] Provider Note   CSN: [id number] Arrival date & time: 10/07/16  [id number]     History   Chief Complaint Chief Complaint  Patient presents with  . Dental Pain    HPI Jazz A Arvilla MarketMills is a 30 y.o. male.  30 year old African-American male with no significant past medical history presents to the ED today with dental pain. Patient is complaining of right left upper molar pain that started this morning. He tried Tylenol for the pain but did not relieve it. The pain is constant. It is acute in onset. The pain does not radiate. Nothing makes better or worse. Patient has history of dental pain and has failed to follow-up with dentist. Patient denies any fever, chills, headache, vision changes, facial swelling, nausea, emesis or any other symptoms.      Past Medical History:  Diagnosis Date  . GERD (gastroesophageal reflux disease)     There are no active problems to display for this patient.   History reviewed. No pertinent surgical history.     Home Medications    Prior to Admission medications   Medication Sig Start Date End Date Taking? Authorizing Provider  acetaminophen (TYLENOL) 500 MG tablet Take 1,000 mg by mouth every 6 (six) hours as needed (for pain).    Historical Provider, MD  clindamycin (CLEOCIN) 150 MG capsule Take 2 capsules (300 mg total) by mouth 4 (four) times daily. X 10 days 09/22/14   Trixie DredgeEmily West, PA-C  HYDROcodone-acetaminophen (NORCO/VICODIN) 5-325 MG per tablet Take 1-2 tablets by mouth every 4 (four) hours as needed for moderate pain or severe pain. 09/22/14   Trixie DredgeEmily West, PA-C  ibuprofen (ADVIL,MOTRIN) 800 MG tablet Take 1 tablet (800 mg total) by mouth every 8 (eight) hours as needed for mild pain or moderate pain. 09/22/14   Trixie DredgeEmily West, PA-C  traMADol (ULTRAM) 50 MG tablet Take 1 tablet (50 mg total) by mouth every 6 (six) hours as needed. 08/07/14   Kathrynn Speedobyn M Hess, PA-C    Family History No family history on file.  Social History Social  History  Substance Use Topics  . Smoking status: Current Every Day Smoker    Packs/day: 0.50    Types: Cigarettes  . Smokeless tobacco: Never Used  . Alcohol use Yes     Comment: occ     Allergies   Amoxicillin and Prilosec [omeprazole]   Review of Systems Review of Systems  Constitutional: Negative for chills and fever.  HENT: Positive for dental problem.   Gastrointestinal: Negative for nausea and vomiting.  Skin: Negative.   All other systems reviewed and are negative.    Physical Exam Updated Vital Signs BP 123/89 (BP Location: Left Arm)   Pulse 92   Temp 98.9 F (37.2 C) (Oral)   Resp 16   SpO2 100%   Physical Exam  Constitutional: He appears well-developed and well-nourished. No distress.  HENT:  Head: Normocephalic and atraumatic.  Mouth/Throat: Uvula is midline, oropharynx is clear and moist and mucous membranes are normal. No trismus in the jaw. Abnormal dentition. No dental abscesses, uvula swelling or dental caries.    No facial edema noted.  Eyes: Pupils are equal, round, and reactive to light. Right eye exhibits no discharge. Left eye exhibits no discharge. No scleral icterus.  Pulmonary/Chest: No respiratory distress.  Musculoskeletal: Normal range of motion.  Neurological: He is alert.  Skin: Skin is warm and dry. Capillary refill takes less than 2 seconds. No pallor.  Nursing note and vitals reviewed.  ED Treatments / Results  Labs (all labs ordered are listed, but only abnormal results are displayed) Labs Reviewed - No data to display  EKG  EKG Interpretation None       Radiology No results found.  Procedures Dental Block Date/Time: 10/07/2016 6:51 AM Performed by: Rise MuLEAPHART, Cynthis Purington T Authorized by: Demetrios LollLEAPHART, Bethannie Iglehart T   Consent:    Consent obtained:  Verbal   Consent given by:  Patient   Risks discussed:  Allergic reaction, infection, hematoma, intravascular injection, nerve damage, pain, swelling and unsuccessful block    Alternatives discussed:  No treatment Indications:    Indications: dental pain   Location:    Block type:  Supraperiosteal   Supraperiosteal location:  Upper teeth   Upper teeth location:  15/LU 2nd molar Procedure details (see MAR for exact dosages):    Syringe type:  Aspirating dental syringe   Needle gauge:  24 G   Anesthetic injected:  Procaine 0.5% WITH epi   Injection procedure:  Anatomic landmarks identified, introduced needle, anatomic landmarks palpated and negative aspiration for blood Post-procedure details:    Outcome:  Anesthesia achieved   Patient tolerance of procedure:  Tolerated well, no immediate complications   (including critical care time)  Medications Ordered in ED Medications  bupivacaine-epinephrine (MARCAINE W/ EPI) 0.5% -1:200000 injection 1.8 mL (1.8 mLs Infiltration Given by Other 10/07/16 0641)  traMADol (ULTRAM) tablet 50 mg (50 mg Oral Given 10/07/16 0641)     Initial Impression / Assessment and Plan / ED Course  I have reviewed the triage vital signs and the nursing notes.  Pertinent labs & imaging results that were available during my care of the patient were reviewed by me and considered in my medical decision making (see chart for details).  Clinical Course   Patient with toothache.  No gross abscess.  Exam unconcerning for Ludwig's angina or spread of infection.  Will treat with clindamycin and pain medicine.  Urged patient to follow-up with dentist.     Final Clinical Impressions(s) / ED Diagnoses   Final diagnoses:  Pain, dental    New Prescriptions New Prescriptions   No medications on file     Rise MuKenneth T Anistyn Graddy, PA-C 10/07/16 [id number]    Tomasita CrumbleAdeleke Oni, MD 10/07/16 1435

## 2016-12-22 ENCOUNTER — Emergency Department (HOSPITAL_COMMUNITY)
Admission: EM | Admit: 2016-12-22 | Discharge: 2016-12-22 | Disposition: A | Discharge status: Home / Self Care | Payer: Self-pay | Attending: Physician Assistant | Admitting: Physician Assistant

## 2016-12-22 ENCOUNTER — Encounter (HOSPITAL_COMMUNITY): Payer: Self-pay

## 2016-12-22 DIAGNOSIS — F17210 Nicotine dependence, cigarettes, uncomplicated: Secondary | ICD-10-CM | POA: Insufficient documentation

## 2016-12-22 DIAGNOSIS — Z79899 Other long term (current) drug therapy: Secondary | ICD-10-CM | POA: Insufficient documentation

## 2016-12-22 DIAGNOSIS — K0889 Other specified disorders of teeth and supporting structures: Secondary | ICD-10-CM | POA: Insufficient documentation

## 2016-12-22 MED ORDER — IBUPROFEN 800 MG PO TABS: 800.0000 mg | ORAL_TABLET | Freq: Three times a day (TID) | ORAL | 0 refills | Status: DC | PRN

## 2016-12-22 MED ORDER — HYDROCODONE-ACETAMINOPHEN 5-325 MG PO TABS
1.0000 | ORAL_TABLET | Freq: Once | ORAL | Status: AC
  Administered 2016-12-22: 1 via ORAL
  Filled 2016-12-22: qty 1

## 2016-12-22 MED ORDER — TRAMADOL HCL 50 MG PO TABS: 50.0000 mg | ORAL_TABLET | Freq: Four times a day (QID) | ORAL | 0 refills | Status: DC | PRN

## 2016-12-22 MED ORDER — IBUPROFEN 800 MG PO TABS
800.0000 mg | ORAL_TABLET | Freq: Once | ORAL | Status: DC
  Filled 2016-12-22: qty 1

## 2016-12-22 NOTE — ED Notes (Signed)
Patient d/c'd self care.  F/U and medications reviewed.  Patient verbalized understanding. 

## 2016-12-22 NOTE — ED Triage Notes (Signed)
Pt presents with c/o dental pain for the past 1.5 months. Pt reports he has not followed up with a dentist.

## 2016-12-22 NOTE — Discharge Instructions (Signed)
Return here as needed. Follow up with a dentist.  °

## 2016-12-27 NOTE — ED Provider Notes (Signed)
[facility] Provider Note   CSN: [id number] Arrival date & time: 12/22/16  2215     History   Chief Complaint Chief Complaint  Patient presents with  . Dental Pain    HPI Mitchell Nolan is a 31 y.o. male.  HPI Patient presents to the emergency department with dental pain that started several days ago.  The patient states that dental issues for quite a while.  He states this is gotten worse recently, states that it is the lower wisdom tooth on the right.  The patient states that palpation of area makes the pain worse.  He states he took over-the-counter medications with no significant relief of his symptoms.  Patient denies difficulty swallowing, difficulty breathing, shortness of breath, nausea, vomiting, fever, weakness, dizziness, headache, neck swelling, or syncope Past Medical History:  Diagnosis Date  . GERD (gastroesophageal reflux disease)     There are no active problems to display for this patient.   History reviewed. No pertinent surgical history.     Home Medications    Prior to Admission medications   Medication Sig Start Date End Date Taking? Authorizing Provider  acetaminophen (TYLENOL) 500 MG tablet Take 1,000 mg by mouth every 6 (six) hours as needed (for pain).    Historical Provider, MD  clindamycin (CLEOCIN) 150 MG capsule Take 2 capsules (300 mg total) by mouth 3 (three) times daily. 10/07/16   Rise MuKenneth T Leaphart, PA-C  HYDROcodone-acetaminophen (NORCO/VICODIN) 5-325 MG per tablet Take 1-2 tablets by mouth every 4 (four) hours as needed for moderate pain or severe pain. 09/22/14   Trixie DredgeEmily West, PA-C  ibuprofen (ADVIL,MOTRIN) 800 MG tablet Take 1 tablet (800 mg total) by mouth every 8 (eight) hours as needed. 12/22/16   Charlestine Nighthristopher Willye Javier, PA-C  traMADol (ULTRAM) 50 MG tablet Take 1 tablet (50 mg total) by mouth every 6 (six) hours as needed for severe pain. 12/22/16   Charlestine Nighthristopher Shifra Swartzentruber, PA-C    Family History No family history on file.  Social  History Social History  Substance Use Topics  . Smoking status: Current Every Day Smoker    Packs/day: 0.50    Types: Cigarettes  . Smokeless tobacco: Never Used  . Alcohol use Yes     Comment: occ     Allergies   Amoxicillin and Prilosec [omeprazole]   Review of Systems Review of Systems All other systems negative except as documented in the HPI. All pertinent positives and negatives as reviewed in the HPI.  Physical Exam Updated Vital Signs BP 138/79 (BP Location: Right Arm)   Pulse 83   Temp 98.3 F (36.8 C) (Oral)   Resp 18   SpO2 100%   Physical Exam  Constitutional: He appears well-developed and well-nourished.  HENT:  Head: Normocephalic and atraumatic.  Mouth/Throat: Uvula is midline, oropharynx is clear and moist and mucous membranes are normal. No trismus in the jaw. Abnormal dentition. Dental caries present. No uvula swelling.  Eyes: Pupils are equal, round, and reactive to light.  Pulmonary/Chest: Effort normal and breath sounds normal.     ED Treatments / Results  Labs (all labs ordered are listed, but only abnormal results are displayed) Labs Reviewed - No data to display  EKG  EKG Interpretation None       Radiology No results found.  Procedures Procedures (including critical care time)  Medications Ordered in ED Medications  HYDROcodone-acetaminophen (NORCO/VICODIN) 5-325 MG per tablet 1 tablet (1 tablet Oral Given 12/22/16 2319)     Initial Impression /  Assessment and Plan / ED Course  I have reviewed the triage vital signs and the nursing notes.  Pertinent labs & imaging results that were available during my care of the patient were reviewed by me and considered in my medical decision making (see chart for details).   patient is given referral to dentistry.  Told return here as needed.  Patient agrees the plan and all questions were answered  Final Clinical Impressions(s) / ED Diagnoses   Final diagnoses:  Toothache    New  Prescriptions Discharge Medication List as of 12/22/2016 11:07 PM       Charlestine Night, PA-C 12/27/16 1619    Courteney Lyn Mackuen, MD 01/03/17 2349

## 2019-05-24 ENCOUNTER — Other Ambulatory Visit: Payer: Self-pay

## 2019-05-24 ENCOUNTER — Ambulatory Visit (HOSPITAL_COMMUNITY)
Admission: EM | Admit: 2019-05-24 | Discharge: 2019-05-24 | Disposition: A | Discharge status: Home / Self Care | Payer: Self-pay | Attending: Internal Medicine | Admitting: Internal Medicine

## 2019-05-24 ENCOUNTER — Encounter (HOSPITAL_COMMUNITY): Payer: Self-pay | Admitting: Emergency Medicine

## 2019-05-24 DIAGNOSIS — M545 Low back pain, unspecified: Secondary | ICD-10-CM

## 2019-05-24 DIAGNOSIS — K047 Periapical abscess without sinus: Secondary | ICD-10-CM

## 2019-05-24 MED ORDER — NAPROXEN 375 MG PO TABS: 375.0000 mg | ORAL_TABLET | Freq: Two times a day (BID) | ORAL | 0 refills | Status: DC

## 2019-05-24 MED ORDER — KETOROLAC TROMETHAMINE 30 MG/ML IJ SOLN
INTRAMUSCULAR | Status: AC
  Filled 2019-05-24: qty 1

## 2019-05-24 MED ORDER — CYCLOBENZAPRINE HCL 10 MG PO TABS: 10.0000 mg | ORAL_TABLET | Freq: Three times a day (TID) | ORAL | 0 refills | Status: DC | PRN

## 2019-05-24 MED ORDER — KETOROLAC TROMETHAMINE 30 MG/ML IJ SOLN
30.0000 mg | Freq: Once | INTRAMUSCULAR | Status: AC
  Administered 2019-05-24: 30 mg via INTRAMUSCULAR

## 2019-05-24 MED ORDER — CLINDAMYCIN HCL 150 MG PO CAPS: 300.0000 mg | ORAL_CAPSULE | Freq: Three times a day (TID) | ORAL | 0 refills | Status: AC

## 2019-05-24 NOTE — ED Triage Notes (Signed)
PT reports back pain for 3 weeks. PT does manual labor. PT also has dental pain for 2-3 days.  Back pain causes left leg to be numb / tingle

## 2019-05-24 NOTE — ED Provider Notes (Signed)
[place]    CSN: [id number] Arrival date & time: 05/24/19  1145     History   Chief Complaint Chief Complaint  Patient presents with  . Back Pain  . Dental Pain    HPI Mitchell Nolan is a 33 y.o. male to urgent care with complaints of back pain or 3 weeks duration and tooth ache of 3 days duration.  Back pain is constant, aggravated by bending and standing up and denies any relieving factors.  Radiates into the left leg occasionally.  No bowel or bladder incontinence.   No trauma to the back.  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  Patient has dental caries and developed dental pain in the right third molar 2 to 3 days ago.  No jaw swelling.  Chewing makes pain worse.  No relieving factors.  No radiation of pain.  HPI  Past Medical History:  Diagnosis Date  . GERD (gastroesophageal reflux disease)     There are no active problems to display for this patient.   History reviewed. No pertinent surgical history.     Home Medications    Prior to Admission medications   Medication Sig Start Date End Date Taking? Authorizing Provider  clindamycin (CLEOCIN) 150 MG capsule Take 2 capsules (300 mg total) by mouth 3 (three) times daily for 7 days. 05/24/19 05/31/19  Chase Picket, MD  cyclobenzaprine (FLEXERIL) 10 MG tablet Take 1 tablet (10 mg total) by mouth 3 (three) times daily as needed for muscle spasms. 05/24/19   Chase Picket, MD  naproxen (NAPROSYN) 375 MG tablet Take 1 tablet (375 mg total) by mouth 2 (two) times daily. 05/24/19   , Myrene Galas, MD    Family History No family history on file.  Social History Social History   Tobacco Use  . Smoking status: Current Every Day Smoker    Packs/day: 0.50    Types: Cigarettes  . Smokeless tobacco: Never Used  Substance Use Topics  . Alcohol use: Yes    Comment: occ  . Drug use: Yes    Types: Marijuana     Allergies   Amoxicillin and  Prilosec [omeprazole]   Review of Systems Review of Systems  Constitutional: Negative.   HENT: Positive for dental problem. Negative for drooling, ear discharge, ear pain, mouth sores, sore throat and trouble swallowing.   Gastrointestinal: Negative.   Genitourinary: Negative for difficulty urinating, dysuria, frequency and urgency.  Musculoskeletal: Positive for back pain. Negative for arthralgias, gait problem and myalgias.  Neurological: Negative for dizziness, weakness and light-headedness.  Hematological: Negative.      Physical Exam Triage Vital Signs ED Triage Vitals  Enc Vitals Group     BP 05/24/19 1335 (!) 116/94     Pulse Rate 05/24/19 1335 (!) 57     Resp 05/24/19 1335 16     Temp 05/24/19 1335 98.9 F (37.2 C)     Temp Source 05/24/19 1335 Oral     SpO2 05/24/19 1335 100 %     Weight --      Height --      Head Circumference --      Peak Flow --      Pain Score 05/24/19 1337 9     Pain Loc --      Pain Edu? --      Excl. in [place]? --    No data found.  Updated  Vital Signs BP (!) 116/94   Pulse (!) 57   Temp 98.9 F (37.2 C) (Oral)   Resp 16   SpO2 100%   Visual Acuity Right Eye Distance:   Left Eye Distance:   Bilateral Distance:    Right Eye Near:   Left Eye Near:    Bilateral Near:     Physical Exam Vitals signs and nursing note reviewed.  Constitutional:      General: He is not in acute distress.    Appearance: He is obese. He is not ill-appearing or toxic-appearing.  HENT:     Mouth/Throat:     Comments: Poor dental hygiene.  Multiple dental caries and cavities.  No discharge from the gums. Cardiovascular:     Rate and Rhythm: Normal rate and regular rhythm.     Pulses: Normal pulses.     Heart sounds: Normal heart sounds.  Pulmonary:     Effort: Pulmonary effort is normal. No respiratory distress.     Breath sounds: Normal breath sounds. No wheezing or rales.  Abdominal:     General: Bowel sounds are normal. There is no distension.      Palpations: Abdomen is soft.     Tenderness: There is no abdominal tenderness. There is no guarding.  Musculoskeletal:        General: No swelling.  Skin:    Capillary Refill: Capillary refill takes less than 2 seconds.  Neurological:     General: No focal deficit present.     Mental Status: He is alert and oriented to person, place, and time.      UC Treatments / Results  Labs (all labs ordered are listed, but only abnormal results are displayed) Labs Reviewed - No data to display  EKG   Radiology No results found.  Procedures Procedures (including critical care time)  Medications Ordered in UC Medications  ketorolac (TORADOL) 30 MG/ML injection 30 mg (30 mg Intramuscular Given 05/24/19 1402)  ketorolac (TORADOL) 30 MG/ML injection (has no administration in time range)    Initial Impression / Assessment and Plan / UC Course  I have reviewed the triage vital signs and the nursing notes.  Pertinent labs & imaging results that were available during my care of the patient were reviewed by me and considered in my medical decision making (see chart for details).     1.  Dental pain likely pulpitis: Dental resources given to the patient Clindamycin 300 mg 3 times daily for 7 days Patient will benefit from dental extraction If pain worsens patient is advised to return to urgent care to be reevaluated He is encouraged to look for dental resources in the community and get dental work done.  2.  Recurrent lumbar sprain likely job-related: Naproxen 375 mg twice daily Warm compress over lower back Gentle stretching exercises Back strengthening exercises Final Clinical Impressions(s) / UC Diagnoses   Final diagnoses:  Acute bilateral low back pain without sciatica  Dental infection   Discharge Instructions   None    ED Prescriptions    Medication Sig Dispense Auth. Provider   clindamycin (CLEOCIN) 150 MG capsule Take 2 capsules (300 mg total) by mouth 3 (three)  times daily for 7 days. 42 capsule , Britta MccreedyPhilip O, MD   naproxen (NAPROSYN) 375 MG tablet Take 1 tablet (375 mg total) by mouth 2 (two) times daily. 30 tablet , Britta MccreedyPhilip O, MD   cyclobenzaprine (FLEXERIL) 10 MG tablet Take 1 tablet (10 mg total) by mouth 3 (three) times daily  as needed for muscle spasms. 30 tablet , Britta Mccreedy O, MD     Controlled Substance Prescriptions [place] Controlled Substance Registry consulted? No   Merrilee Jansky,  O, MD 05/24/19 1504

## 2021-05-03 ENCOUNTER — Ambulatory Visit (HOSPITAL_COMMUNITY)
Admission: EM | Admit: 2021-05-03 | Discharge: 2021-05-03 | Disposition: A | Discharge status: Home / Self Care | Payer: Self-pay | Attending: Internal Medicine | Admitting: Internal Medicine

## 2021-05-03 ENCOUNTER — Encounter (HOSPITAL_COMMUNITY): Payer: Self-pay

## 2021-05-03 ENCOUNTER — Other Ambulatory Visit: Payer: Self-pay

## 2021-05-03 DIAGNOSIS — M5442 Lumbago with sciatica, left side: Secondary | ICD-10-CM

## 2021-05-03 DIAGNOSIS — M5432 Sciatica, left side: Secondary | ICD-10-CM

## 2021-05-03 MED ORDER — CYCLOBENZAPRINE HCL 10 MG PO TABS: 10.0000 mg | ORAL_TABLET | Freq: Two times a day (BID) | ORAL | 0 refills | Status: AC | PRN

## 2021-05-03 MED ORDER — PREDNISONE 20 MG PO TABS: 40.0000 mg | ORAL_TABLET | Freq: Every day | ORAL | 0 refills | Status: AC

## 2021-05-03 NOTE — ED Triage Notes (Signed)
Pt presents with lower back pain that started 2 weeks ago.   Pt states he is not sure he injured himself. States he lifts heavy at work.

## 2021-05-03 NOTE — ED Provider Notes (Signed)
[facility]    CSN: [id number] Arrival date & time: 05/03/21  1432      History   Chief Complaint Chief Complaint  Patient presents with   Back Pain    HPI Mitchell Nolan is a 35 y.o. male.   Patient has had bilateral lower back pain that radiates down his left leg occasionally for approximately 2 weeks. Denies numbness or tingling.  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 Has had recurrent low back pain with sciatica as well. Denies urinary burning, frequency, urinary incontinence, stool incontinence. Pain is aggravated by movement including sitting and standing. States that he has taken OTC ibuprofen and tylenol without any relief of pain.    Back Pain  Past Medical History:  Diagnosis Date   GERD (gastroesophageal reflux disease)     There are no problems to display for this patient.   History reviewed. No pertinent surgical history.     Home Medications    Prior to Admission medications   Medication Sig Start Date End Date Taking? Authorizing Provider  cyclobenzaprine (FLEXERIL) 10 MG tablet Take 1 tablet (10 mg total) by mouth 2 (two) times daily as needed for muscle spasms. 05/03/21  Yes Lance Muss, FNP  predniSONE (DELTASONE) 20 MG tablet Take 2 tablets (40 mg total) by mouth daily for 5 days. 05/03/21 05/08/21 Yes Lance Muss, FNP    Family History History reviewed. No pertinent family history.  Social History Social History   Tobacco Use   Smoking status: Every Day    Packs/day: 0.50    Pack years: 0.00    Types: Cigarettes   Smokeless tobacco: Never  Substance Use Topics   Alcohol use: Yes    Comment: occ   Drug use: Yes    Types: Marijuana     Allergies   Amoxicillin and Prilosec [omeprazole]   Review of Systems Review of Systems  Musculoskeletal:  Positive for back pain.  Per HPI  Physical Exam Triage Vital Signs ED Triage Vitals  Enc Vitals Group      BP 05/03/21 1541 125/80     Pulse Rate 05/03/21 1541 65     Resp 05/03/21 1541 17     Temp 05/03/21 1541 99.2 F (37.3 C)     Temp Source 05/03/21 1541 Oral     SpO2 05/03/21 1541 100 %     Weight --      Height --      Head Circumference --      Peak Flow --      Pain Score 05/03/21 1540 7     Pain Loc --      Pain Edu? --      Excl. in [place]? --    No data found.  Updated Vital Signs BP 125/80 (BP Location: Right Arm)   Pulse 65   Temp 97.6 F (36.4 C) (Oral)   Resp 17   SpO2 100%   Visual Acuity Right Eye Distance:   Left Eye Distance:   Bilateral Distance:    Right Eye Near:   Left Eye Near:    Bilateral Near:     Physical Exam Constitutional:      Appearance: Normal appearance.  HENT:     Head: Normocephalic and atraumatic.  Eyes:     Extraocular Movements: Extraocular movements intact.     Conjunctiva/sclera: Conjunctivae normal.  Pulmonary:     Effort:  Pulmonary effort is normal.  Musculoskeletal:        General: Tenderness present.     Cervical back: Normal.     Thoracic back: Normal.     Lumbar back: Tenderness present. No swelling, edema or signs of trauma. Normal range of motion.     Comments: Negative for tenderness on palpation. Patient states that pain only occurs with movement. Deep tendon reflexes normal.   Neurological:     General: No focal deficit present.     Mental Status: He is alert and oriented to person, place, and time. Mental status is at baseline.     Deep Tendon Reflexes: Reflexes are normal and symmetric.  Psychiatric:        Mood and Affect: Mood normal.        Behavior: Behavior normal.        Thought Content: Thought content normal.        Judgment: Judgment normal.     UC Treatments / Results  Labs (all labs ordered are listed, but only abnormal results are displayed) Labs Reviewed - No data to display  EKG   Radiology No results found.  Procedures Procedures (including critical care time)  Medications  Ordered in UC Medications - No data to display  Initial Impression / Assessment and Plan / UC Course  I have reviewed the triage vital signs and the nursing notes.  Pertinent labs & imaging results that were available during my care of the patient were reviewed by me and considered in my medical decision making (see chart for details).     Prednisone to decrease inflammation and flexeril as needed for back pain/spasms. Patient was advised to avoid ibuprofen or other NSAID's while on steroid. May take tylenol as needed for pain relief. Patient was educated that flexeril can cause drowsiness and to avoid driving while taking medication. Alternate ice and heat application. Follow up if bowel or bladder incontinence develop. Discussed strict return precautions. Patient verbalized understanding and is agreeable with plan.  Final Clinical Impressions(s) / UC Diagnoses   Final diagnoses:  Sciatica of left side  Acute bilateral low back pain with left-sided sciatica     Discharge Instructions      HOME CARE INSTRUCTIONS: Alternate ice and heat application, each for 15 minutes at a time a few hours apart. May take tylenol as needed for pain relief. Do not take ibuprofen, naprosyn, or over the counter advil or aleve while taking prednisone.   SEEK MEDICAL CARE IF: You have pain that is not relieved with rest or medicine. You have pain that does not improve in 1 week. You have new symptoms. You are generally not feeling well.  SEEK IMMEDIATE MEDICAL CARE IF: You develop new bowel or bladder control problems. You have unusual weakness or numbness in your arms or legs. You develop nausea or vomiting. You develop abdominal pain. You feel faint.      ED Prescriptions     Medication Sig Dispense Auth. Provider   predniSONE (DELTASONE) 20 MG tablet Take 2 tablets (40 mg total) by mouth daily for 5 days. 10 tablet Lance Muss, FNP   cyclobenzaprine (FLEXERIL) 10 MG tablet Take 1 tablet  (10 mg total) by mouth 2 (two) times daily as needed for muscle spasms. 20 tablet Lance Muss, FNP      PDMP not reviewed this encounter.   Lance Muss, FNP 05/03/21 [phone]

## 2021-05-03 NOTE — Discharge Instructions (Addendum)
HOME CARE INSTRUCTIONS: Alternate ice and heat application, each for 15 minutes at a time a few hours apart. May take tylenol as needed for pain relief. Do not take ibuprofen, naprosyn, or over the counter advil or aleve while taking prednisone.   SEEK MEDICAL CARE IF: You have pain that is not relieved with rest or medicine. You have pain that does not improve in 1 week. You have new symptoms. You are generally not feeling well.  SEEK IMMEDIATE MEDICAL CARE IF: You develop new bowel or bladder control problems. You have unusual weakness or numbness in your arms or legs. You develop nausea or vomiting. You develop abdominal pain. You feel faint.

## 2021-05-03 NOTE — ED Triage Notes (Signed)
Pt states every time he sits, stands or walks his back hurts.
